# Patient Record
Sex: Female | Born: 1940
Health system: Southern US, Community
[De-identification: ages and names within clinical notes are randomized; demographics above are authoritative.]

## PROBLEM LIST (undated history)

## (undated) DIAGNOSIS — C4491 Basal cell carcinoma of skin, unspecified: Secondary | ICD-10-CM

## (undated) DIAGNOSIS — I10 Essential (primary) hypertension: Secondary | ICD-10-CM

## (undated) HISTORY — DX: Essential (primary) hypertension: I10

---
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
## 1980-05-10 HISTORY — PX: TOTAL ABDOMINAL HYSTERECTOMY: SHX209

## 1998-01-01 ENCOUNTER — Other Ambulatory Visit: Admission: RE | Admit: 1998-01-01 | Discharge: 1998-01-01 | Payer: Self-pay | Admitting: Obstetrics & Gynecology

## 1999-01-06 ENCOUNTER — Other Ambulatory Visit: Admission: RE | Admit: 1999-01-06 | Discharge: 1999-01-06 | Payer: Self-pay | Admitting: Obstetrics & Gynecology

## 1999-11-04 ENCOUNTER — Ambulatory Visit (HOSPITAL_COMMUNITY): Admission: RE | Admit: 1999-11-04 | Discharge: 1999-11-04 | Payer: Self-pay | Admitting: Obstetrics & Gynecology

## 1999-11-04 ENCOUNTER — Encounter (INDEPENDENT_AMBULATORY_CARE_PROVIDER_SITE_OTHER): Payer: Self-pay

## 1999-11-18 ENCOUNTER — Other Ambulatory Visit: Admission: RE | Admit: 1999-11-18 | Discharge: 1999-11-18 | Payer: Self-pay | Admitting: Obstetrics & Gynecology

## 2000-05-10 HISTORY — PX: LAPAROSCOPIC CHOLECYSTECTOMY: SUR755

## 2000-09-19 ENCOUNTER — Emergency Department (HOSPITAL_COMMUNITY): Admission: EM | Admit: 2000-09-19 | Discharge: 2000-09-19 | Payer: Self-pay | Admitting: Emergency Medicine

## 2000-09-19 ENCOUNTER — Encounter: Payer: Self-pay | Admitting: Emergency Medicine

## 2000-09-27 ENCOUNTER — Encounter: Admission: RE | Admit: 2000-09-27 | Discharge: 2000-09-27 | Payer: Self-pay | Admitting: Internal Medicine

## 2000-09-27 ENCOUNTER — Encounter: Payer: Self-pay | Admitting: Internal Medicine

## 2000-09-29 ENCOUNTER — Encounter: Payer: Self-pay | Admitting: Internal Medicine

## 2000-09-29 ENCOUNTER — Encounter: Admission: RE | Admit: 2000-09-29 | Discharge: 2000-09-29 | Payer: Self-pay | Admitting: Internal Medicine

## 2000-10-06 ENCOUNTER — Ambulatory Visit (HOSPITAL_COMMUNITY): Admission: RE | Admit: 2000-10-06 | Discharge: 2000-10-07 | Payer: Self-pay | Admitting: General Surgery

## 2000-10-06 ENCOUNTER — Encounter (INDEPENDENT_AMBULATORY_CARE_PROVIDER_SITE_OTHER): Payer: Self-pay | Admitting: Specialist

## 2001-02-07 ENCOUNTER — Encounter: Payer: Self-pay | Admitting: Internal Medicine

## 2001-02-07 ENCOUNTER — Encounter: Admission: RE | Admit: 2001-02-07 | Discharge: 2001-02-07 | Payer: Self-pay | Admitting: Internal Medicine

## 2001-02-28 ENCOUNTER — Other Ambulatory Visit: Admission: RE | Admit: 2001-02-28 | Discharge: 2001-02-28 | Payer: Self-pay | Admitting: Obstetrics & Gynecology

## 2002-03-05 ENCOUNTER — Other Ambulatory Visit: Admission: RE | Admit: 2002-03-05 | Discharge: 2002-03-05 | Payer: Self-pay | Admitting: Obstetrics & Gynecology

## 2003-03-04 ENCOUNTER — Other Ambulatory Visit: Admission: RE | Admit: 2003-03-04 | Discharge: 2003-03-04 | Payer: Self-pay | Admitting: Obstetrics & Gynecology

## 2004-03-05 ENCOUNTER — Other Ambulatory Visit: Admission: RE | Admit: 2004-03-05 | Discharge: 2004-03-05 | Payer: Self-pay | Admitting: Obstetrics & Gynecology

## 2004-05-13 ENCOUNTER — Encounter: Admission: RE | Admit: 2004-05-13 | Discharge: 2004-05-13 | Payer: Self-pay | Admitting: Internal Medicine

## 2005-03-17 ENCOUNTER — Other Ambulatory Visit: Admission: RE | Admit: 2005-03-17 | Discharge: 2005-03-17 | Payer: Self-pay | Admitting: Obstetrics & Gynecology

## 2007-08-03 ENCOUNTER — Ambulatory Visit: Payer: Self-pay | Admitting: Internal Medicine

## 2007-08-10 ENCOUNTER — Ambulatory Visit: Payer: Self-pay | Admitting: Internal Medicine

## 2007-08-10 ENCOUNTER — Encounter: Payer: Self-pay | Admitting: Internal Medicine

## 2008-09-11 DIAGNOSIS — C4491 Basal cell carcinoma of skin, unspecified: Secondary | ICD-10-CM

## 2008-09-11 HISTORY — DX: Basal cell carcinoma of skin, unspecified: C44.91

## 2010-05-10 HISTORY — PX: OTHER SURGICAL HISTORY: SHX169

## 2010-09-25 NOTE — Op Note (Signed)
[place]. [facility]  Patient:    Christine Hall, Christine Hall                     MRN: [id number] Proc. Date: 10/06/00 Adm. Date:  [id number] Attending:  Sonda Primes                           Operative Report  PREOPERATIVE DIAGNOSIS:  Chronic calculus cholecystitis.  POSTOPERATIVE DIAGNOSIS:  Chronic calculus cholecystitis.  OPERATION PERFORMED:  Laparoscopic cholecystectomy.  SURGEON:  Mardene Celeste. Lurene Shadow, M.D.  ASSISTANT:  Marnee Spring. Wiliam Ke, M.D.  ANESTHESIA:  General.  INDICATIONS FOR PROCEDURE:  The patient is a 70 year old woman with a severe iodine allergy who presents with epigastric and substernal pain associated with nausea and vomiting.  On ultrasound she has cholelithiasis with a thick-walled gallbladder.  She had some mild hyperamylasemia up to 146.  Liver function tests were normal.  She has not been having any chills or jaundice. She is brought to the operating room now for cholecystectomy.  It is felt that we should not do a cholangiogram unless otherwise indicated except for her mild hyperamylasemia because of her iodine allergy.  DESCRIPTION OF PROCEDURE:  Following the induction of anesthesia, the patient was positioned supinely.  The abdomen routinely prepped and draped with Hibiclens solution.  Open laparoscopy at the umbilicus with insertion of a Hasson type cannula and insufflation of the peritoneal cavity up to 14 mmHg pressure using carbon dioxide.  Visual exploration showed the gallbladder to be chronically scarred with multiple adhesions of the gallbladder to the duodenum and surrounding omentum.  Liver edges were sharp.  The liver surface was somewhat sugar-coated, otherwise within normal limits.  Under direct vision, epigastric and lateral ports were placed.  The gallbladder was grasped and retracted cephalad and multiple adhesions were carefully dissected down carrying the dissection down to the ampulla where the cystic duct and  cystic artery were clearly identified.  The cystic duct was traced up to its entry into the gallbladder and the cystic artery traced to its entry into the gallbladder wall.  Both the cystic duct and cystic artery were doubly clipped and transected.  The cystic duct caliber was at the upper limits of normal in size.  The gallbladder was then dissected free from the liver edge using electrocautery and maintaining hemostasis throughout the entire course of the dissection.  At the end of the dissection, additional bleeding points treated with electrocautery.  The camera moved from the epigastric port and the gallbladder retrieved through the umbilical port without difficulty.  Sponge, instrument and sharp counts were verified.  The trocars were removed under direct vision.  The pneumoperitoneum allowed to deflate and the wounds closed in layers as follows.  The umbilical wound in two layers with 0 Dexon and 4-0 Dexon.  Epigastric and lateral wounds closed with 4-0 Dexon sutures.  All wounds were reinforced with Steri-Strips and sterile dressings were applied. Anesthetic reversed.  Patient removed from the operating room to the recovery room in stable condition having tolerated the procedure well.  The patient tolerated the procedure well. DD:  10/06/00 TD:  10/06/00 Job: [id number]

## 2010-09-25 NOTE — Op Note (Signed)
[facility]  Patient:    Christine Hall, Christine Hall                     MRN: [id number] Proc. Date: 11/04/99 Adm. Date:  [id number] Attending:  Minette Headland                           Operative Report  PREOPERATIVE DIAGNOSES:       1. Cystic pelvic mass at vaginal cuff.                               2. Known history of previous pelvic                                  endometriosis.                               3. Recent episode of brown vaginal discharge                                  consistent with scant bleeding vaginally.  POSTOPERATIVE DIAGNOSES:      1. Pelvic adhesions.                               2. Cystic lesion posterior vagina, near the apex                                  consistent with a small endometrioma.                               3. Adhesions of ileum to peritoneum overlying                                  vagina.                               4. Right adnexal adhesions.  OPERATIVE PROCEDURE:          Laparoscopy, lysis of pelvic adhesions both to bowel and adnexa, right salpingo-oophorectomy, fulguration of cystic lesion of posterior vagina.  SURGEON:                      Freddy Finner, M.D.  ANESTHESIA:                   General endotracheal.  INTRAOPERATIVE COMPLICATIONS: None.  INTRAOPERATIVE FINDINGS:  As in the post operative diagnoses.  Findings were recorded in still photographs which were retained in the office record.  DESCRIPTION OF PROCEDURE:     The patient was admitted on the morning of surgery, brought to the operating room and there placed under adequate general endotracheal anesthesia, placed in the dorsolithotomy position using the [facility] stirrup system.  Prep was carried out with Hibiclens because of an allergy to iodine.  The bladder was evacuated with a Robinson catheter.  Sponge forceps  with sponges in the tip was placed into the vagina for use during the procedure.  Sterile drapes were applied.   Two small incisions were made; one at the umbilicus and one just above the symphysis through an old lower abdominal transverse scar.  A 10 mm trocar was introduced at the umbilicus while elevating the anterior abdominal wall manually.  Direct inspection revealed adequate placement with no evidence of injury on entry. Pneumoperitoneum was allowed to accumulate with carbon dioxide gas.  A 5 mm trocar was placed through the lower incision.  A blunt probe and later grasping forceps was used through this lower trocar sleeve.  After careful systematic examination of the pelvic and abdominal contents, the right adnexal adhesions were dissected and the right infundibulopelvic ligament and structures adjacent to the ovary were progressively fulgurated with bipolar forceps and then divided sharply.  The ovary was morcellated and removed through the umbilical trocar sleeve.  Small bleeding sources were controlled with the bipolar forceps.  With very careful dissection, the ileum was then dissected off the peritoneum overlying the vagina.  The lesion posteriorly was fulgurated with the bipolar forceps x 2.  On completion of the procedure hemostasis was adequate.  All instruments were removed.  Gas was allowed escape from the abdomen.  Skin incisions were closed with interrupted subcuticular sutures of 3-0 Dexon.  Steri-Strips were applied to the lower incision.  A total of 10 cc of 0.50% Marcaine was injected into the incision sites for postoperative analgesia.  The patient tolerated the operative procedure well.  She was awakened and taken to the recovery room in good condition.  She will discharged in the immediate postoperative period for follow-up in the office in approximately one week. DD:  11/04/99 TD:  11/04/99 Job: [id number]

## 2011-04-29 ENCOUNTER — Other Ambulatory Visit: Payer: Self-pay | Admitting: Internal Medicine

## 2011-04-29 ENCOUNTER — Ambulatory Visit
Admission: RE | Admit: 2011-04-29 | Discharge: 2011-04-29 | Disposition: A | Discharge status: Home / Self Care | Payer: Medicare Other | Source: Ambulatory Visit | Attending: Internal Medicine | Admitting: Internal Medicine

## 2011-04-29 DIAGNOSIS — R609 Edema, unspecified: Secondary | ICD-10-CM

## 2012-08-02 ENCOUNTER — Encounter: Payer: Self-pay | Admitting: Internal Medicine

## 2013-02-06 ENCOUNTER — Encounter: Payer: Self-pay | Admitting: Internal Medicine

## 2013-04-11 ENCOUNTER — Ambulatory Visit (AMBULATORY_SURGERY_CENTER): Payer: Self-pay | Admitting: *Deleted

## 2013-04-11 VITALS — Ht 63.0 in | Wt 160.6 lb

## 2013-04-11 DIAGNOSIS — Z86010 Personal history of colonic polyps: Secondary | ICD-10-CM

## 2013-04-11 MED ORDER — MOVIPREP 100 G PO SOLR: ORAL | Status: DC

## 2013-04-11 NOTE — Progress Notes (Signed)
No allergies to eggs or soy. No problems with anesthesia.  

## 2013-04-25 ENCOUNTER — Ambulatory Visit (AMBULATORY_SURGERY_CENTER): Payer: Medicare Other | Admitting: Internal Medicine

## 2013-04-25 ENCOUNTER — Encounter: Payer: Self-pay | Admitting: Internal Medicine

## 2013-04-25 VITALS — BP 141/85 | HR 84 | Temp 97.4°F | Resp 18 | Ht 63.0 in | Wt 160.0 lb

## 2013-04-25 DIAGNOSIS — Z860100 Personal history of colon polyps, unspecified: Secondary | ICD-10-CM

## 2013-04-25 DIAGNOSIS — Z800 Family history of malignant neoplasm of digestive organs: Secondary | ICD-10-CM

## 2013-04-25 MED ORDER — SODIUM CHLORIDE 0.9 % IV SOLN: 500.0000 mL | INTRAVENOUS | Status: DC

## 2013-04-25 NOTE — Progress Notes (Signed)
A/ox3 pleased with MAC, report to Celia RN 

## 2013-04-25 NOTE — Progress Notes (Signed)
Patient did not experience any of the following events: a burn prior to discharge; a fall within the facility; wrong site/side/patient/procedure/implant event; or a hospital transfer or hospital admission upon discharge from the facility. (G8907) Patient did not have preoperative order for IV antibiotic SSI prophylaxis. (G8918)  

## 2013-04-25 NOTE — Op Note (Signed)
[facility] [address] [place], [address]   COLONOSCOPY PROCEDURE REPORT  PATIENT: Christine, Hall  MR#: [id number] BIRTHDATE: 30-Sep-1940 , 72  yrs. old GENDER: Female ENDOSCOPIST: Hart Carwin, MD REFERRED AO:ZHYQMV Donette Larry, M.D. PROCEDURE DATE:  04/25/2013 PROCEDURE:   Colonoscopy, screening First Screening Colonoscopy - Avg.  risk and is 50 yrs.  old or older - No.  Prior Negative Screening - Now for repeat screening. N/A  History of Adenoma - Now for follow-up colonoscopy & has been > or = to 3 yrs.  Yes hx of adenoma.  Has been 3 or more years since last colonoscopy.  Polyps Removed Today? No.  Recommend repeat exam, <10 yrs? Yes.  High risk (family or personal hx). ASA CLASS:   Class II INDICATIONS:family history of colon cancer in appearance.  Prior colonoscopies in 1995, 2000, 2009.  Last colonoscopy showed adenomatous polyp. MEDICATIONS: MAC sedation, administered by CRNA and Propofol (Diprivan) 180 mg IV  DESCRIPTION OF PROCEDURE:   After the risks benefits and alternatives of the procedure were thoroughly explained, informed consent was obtained.  A digital rectal exam revealed no abnormalities of the rectum.   The LB PFC-H190 [id number]  endoscope was introduced through the anus and advanced to the cecum, which was identified by both the appendix and ileocecal valve. No adverse events experienced.   The quality of the prep was good, using MoviPrep  The instrument was then slowly withdrawn as the colon was fully examined.      COLON FINDINGS: A normal appearing cecum, ileocecal valve, and appendiceal orifice were identified.  The ascending, hepatic flexure, transverse, splenic flexure, descending, sigmoid colon and rectum appeared unremarkable.  No polyps or cancers were seen. Retroflexed views revealed no abnormalities. The time to cecum=5 minutes 7 seconds.  Withdrawal time=8 minutes 52 seconds.  The scope was withdrawn and the procedure  completed. COMPLICATIONS: There were no complications.  ENDOSCOPIC IMPRESSION: Normal colon  RECOMMENDATIONS: high-fiber diet Recall colonoscopy in 5 years   eSigned:  Hart Carwin, MD 04/25/2013 10:42 AM   cc:   PATIENT NAME:  Christine, Hall MR#: [id number]

## 2013-04-25 NOTE — Patient Instructions (Signed)
Discharge instructions given with verbal understanding. Normal exam. Resume previous medications. YOU HAD AN ENDOSCOPIC PROCEDURE TODAY AT THE [facility]: Refer to the procedure report that was given to you for any specific questions about what was found during the examination.  If the procedure report does not answer your questions, please call your gastroenterologist to clarify.  If you requested that your care partner not be given the details of your procedure findings, then the procedure report has been included in a sealed envelope for you to review at your convenience later.  YOU SHOULD EXPECT: Some feelings of bloating in the abdomen. Passage of more gas than usual.  Walking can help get rid of the air that was put into your GI tract during the procedure and reduce the bloating. If you had a lower endoscopy (such as a colonoscopy or flexible sigmoidoscopy) you may notice spotting of blood in your stool or on the toilet paper. If you underwent a bowel prep for your procedure, then you may not have a normal bowel movement for a few days.  DIET: Your first meal following the procedure should be a light meal and then it is ok to progress to your normal diet.  A half-sandwich or bowl of soup is an example of a good first meal.  Heavy or fried foods are harder to digest and may make you feel nauseous or bloated.  Likewise meals heavy in dairy and vegetables can cause extra gas to form and this can also increase the bloating.  Drink plenty of fluids but you should avoid alcoholic beverages for 24 hours.  ACTIVITY: Your care partner should take you home directly after the procedure.  You should plan to take it easy, moving slowly for the rest of the day.  You can resume normal activity the day after the procedure however you should NOT DRIVE or use heavy machinery for 24 hours (because of the sedation medicines used during the test).    SYMPTOMS TO REPORT IMMEDIATELY: A gastroenterologist  can be reached at any hour.  During normal business hours, 8:30 AM to 5:00 PM Monday through Friday, call [phone].  After hours and on weekends, please call the GI answering service at [phone] who will take a message and have the physician on call contact you.   Following lower endoscopy (colonoscopy or flexible sigmoidoscopy):  Excessive amounts of blood in the stool  Significant tenderness or worsening of abdominal pains  Swelling of the abdomen that is new, acute  Fever of 100F or higher  FOLLOW UP: If any biopsies were taken you will be contacted by phone or by letter within the next 1-3 weeks.  Call your gastroenterologist if you have not heard about the biopsies in 3 weeks.  Our staff will call the home number listed on your records the next business day following your procedure to check on you and address any questions or concerns that you may have at that time regarding the information given to you following your procedure. This is a courtesy call and so if there is no answer at the home number and we have not heard from you through the emergency physician on call, we will assume that you have returned to your regular daily activities without incident.  SIGNATURES/CONFIDENTIALITY: You and/or your care partner have signed paperwork which will be entered into your electronic medical record.  These signatures attest to the fact that that the information above on your After Visit Summary has been reviewed   and is understood.  Full responsibility of the confidentiality of this discharge information lies with you and/or your care-partner. 

## 2013-04-26 ENCOUNTER — Telehealth: Payer: Self-pay

## 2013-04-26 NOTE — Telephone Encounter (Signed)
  Follow up Call-  Call back number 04/25/2013  Post procedure Call Back phone  # [phone]  Permission to leave phone message Yes     Patient questions:  Do you have a fever, pain , or abdominal swelling? no Pain Score  0 *  Have you tolerated food without any problems? yes  Have you been able to return to your normal activities? yes  Do you have any questions about your discharge instructions: Diet   no Medications  no Follow up visit  no  Do you have questions or concerns about your Care? no  Actions: * If pain score is 4 or above: No action needed, pain <4.

## 2013-10-18 ENCOUNTER — Other Ambulatory Visit: Payer: Self-pay | Admitting: Dermatology

## 2013-10-18 DIAGNOSIS — C4491 Basal cell carcinoma of skin, unspecified: Secondary | ICD-10-CM

## 2013-10-18 HISTORY — DX: Basal cell carcinoma of skin, unspecified: C44.91

## 2014-08-28 ENCOUNTER — Other Ambulatory Visit: Payer: Self-pay | Admitting: Obstetrics & Gynecology

## 2014-08-30 ENCOUNTER — Other Ambulatory Visit: Payer: Self-pay | Admitting: Obstetrics & Gynecology

## 2014-08-30 DIAGNOSIS — R928 Other abnormal and inconclusive findings on diagnostic imaging of breast: Secondary | ICD-10-CM

## 2014-08-30 LAB — CYTOLOGY - PAP

## 2014-09-06 ENCOUNTER — Ambulatory Visit
Admission: RE | Admit: 2014-09-06 | Discharge: 2014-09-06 | Disposition: A | Discharge status: Home / Self Care | Payer: Medicare Other | Source: Ambulatory Visit | Attending: Obstetrics & Gynecology | Admitting: Obstetrics & Gynecology

## 2014-09-06 ENCOUNTER — Encounter (INDEPENDENT_AMBULATORY_CARE_PROVIDER_SITE_OTHER): Payer: Self-pay

## 2014-09-06 DIAGNOSIS — R928 Other abnormal and inconclusive findings on diagnostic imaging of breast: Secondary | ICD-10-CM

## 2015-02-26 ENCOUNTER — Encounter: Payer: Self-pay | Admitting: Internal Medicine

## 2015-04-01 ENCOUNTER — Other Ambulatory Visit: Payer: Self-pay | Admitting: Internal Medicine

## 2015-04-01 DIAGNOSIS — R1012 Left upper quadrant pain: Principal | ICD-10-CM

## 2015-04-01 DIAGNOSIS — R1011 Right upper quadrant pain: Secondary | ICD-10-CM

## 2015-04-08 ENCOUNTER — Ambulatory Visit
Admission: RE | Admit: 2015-04-08 | Discharge: 2015-04-08 | Disposition: A | Discharge status: Home / Self Care | Payer: Medicare Other | Source: Ambulatory Visit | Attending: Internal Medicine | Admitting: Internal Medicine

## 2015-04-08 ENCOUNTER — Other Ambulatory Visit: Payer: Self-pay | Admitting: Internal Medicine

## 2015-04-08 ENCOUNTER — Other Ambulatory Visit: Payer: Medicare Other

## 2015-04-08 DIAGNOSIS — R1012 Left upper quadrant pain: Principal | ICD-10-CM

## 2015-04-08 DIAGNOSIS — R1011 Right upper quadrant pain: Secondary | ICD-10-CM

## 2017-01-20 ENCOUNTER — Ambulatory Visit
Admission: RE | Admit: 2017-01-20 | Discharge: 2017-01-20 | Disposition: A | Discharge status: Home / Self Care | Payer: Medicare Other | Source: Ambulatory Visit | Attending: Nurse Practitioner | Admitting: Nurse Practitioner

## 2017-01-20 ENCOUNTER — Other Ambulatory Visit: Payer: Self-pay | Admitting: Nurse Practitioner

## 2017-01-20 DIAGNOSIS — M79604 Pain in right leg: Secondary | ICD-10-CM

## 2017-02-09 ENCOUNTER — Other Ambulatory Visit: Payer: Self-pay | Admitting: Dermatology

## 2018-06-15 ENCOUNTER — Encounter: Payer: Self-pay | Admitting: Gastroenterology

## 2018-09-14 ENCOUNTER — Encounter: Payer: Self-pay | Admitting: Gastroenterology

## 2020-03-11 ENCOUNTER — Ambulatory Visit: Payer: Medicare Other | Attending: Internal Medicine

## 2020-03-11 ENCOUNTER — Other Ambulatory Visit (HOSPITAL_BASED_OUTPATIENT_CLINIC_OR_DEPARTMENT_OTHER): Payer: Self-pay | Admitting: Internal Medicine

## 2020-03-11 DIAGNOSIS — Z23 Encounter for immunization: Secondary | ICD-10-CM

## 2020-03-11 NOTE — Progress Notes (Signed)
   Covid-19 Vaccination Clinic  Name:  Christine Hall    MRN: [id number] DOB: Jan 28, 1941  03/11/2020  [name] was observed post Covid-19 immunization for 15 minutes without incident. She was provided with Vaccine Information Sheet and instruction to access the V-Safe system.   [name] was instructed to call 911 with any severe reactions post vaccine: Marland Kitchen Difficulty breathing  . Swelling of face and throat  . A fast heartbeat  . A bad rash all over body  . Dizziness and weakness

## 2020-03-20 MED FILL — MODERNA COVID-19 VACCINE 10: 100 | 1 days supply | Qty: 0 | Fill #0

## 2021-01-28 ENCOUNTER — Other Ambulatory Visit (HOSPITAL_BASED_OUTPATIENT_CLINIC_OR_DEPARTMENT_OTHER): Payer: Self-pay

## 2021-01-28 MED ORDER — INFLUENZA VAC A&B SA ADJ QUAD 0.5 ML IM PRSY
PREFILLED_SYRINGE | INTRAMUSCULAR | 0 refills | Status: DC
  Filled 2021-01-28: qty 0.5, 1d supply, fill #0

## 2021-02-17 ENCOUNTER — Ambulatory Visit: Payer: Medicare Other | Attending: Internal Medicine

## 2021-02-17 DIAGNOSIS — Z23 Encounter for immunization: Secondary | ICD-10-CM

## 2021-02-17 NOTE — Progress Notes (Signed)
   Covid-19 Vaccination Clinic  Name:  MAVEN ROSANDER    MRN: [id number] DOB: [birth year]  02/17/2021  Ms. Ballon was observed post Covid-19 immunization for 15 minutes without incident. She was provided with Vaccine Information Sheet and instruction to access the V-Safe system.   Ms. Skelton was instructed to call 911 with any severe reactions post vaccine: Difficulty breathing  Swelling of face and throat  A fast heartbeat  A bad rash all over body  Dizziness and weakness

## 2021-02-27 ENCOUNTER — Other Ambulatory Visit (HOSPITAL_BASED_OUTPATIENT_CLINIC_OR_DEPARTMENT_OTHER): Payer: Self-pay

## 2021-02-27 MED ORDER — MODERNA COVID-19 BIVAL BOOSTER 50 MCG/0.5ML IM SUSP
INTRAMUSCULAR | 0 refills | Status: DC
  Filled 2021-02-27: qty 0.5, 1d supply, fill #0

## 2021-03-17 ENCOUNTER — Other Ambulatory Visit: Payer: Self-pay | Admitting: Internal Medicine

## 2021-03-17 DIAGNOSIS — M7989 Other specified soft tissue disorders: Secondary | ICD-10-CM

## 2021-03-20 ENCOUNTER — Other Ambulatory Visit: Payer: Medicare Other

## 2021-05-06 ENCOUNTER — Ambulatory Visit: Payer: Medicare Other | Admitting: Cardiology

## 2021-05-07 ENCOUNTER — Other Ambulatory Visit: Payer: Self-pay

## 2021-05-07 ENCOUNTER — Encounter: Payer: Self-pay | Admitting: Cardiology

## 2021-05-07 ENCOUNTER — Ambulatory Visit: Payer: Medicare Other | Admitting: Cardiology

## 2021-05-07 VITALS — BP 164/90 | HR 74 | Temp 98.1°F | Ht <= 58 in | Wt 156.0 lb

## 2021-05-07 DIAGNOSIS — R9431 Abnormal electrocardiogram [ECG] [EKG]: Secondary | ICD-10-CM

## 2021-05-07 DIAGNOSIS — I5032 Chronic diastolic (congestive) heart failure: Secondary | ICD-10-CM

## 2021-05-07 DIAGNOSIS — R0609 Other forms of dyspnea: Secondary | ICD-10-CM

## 2021-05-07 DIAGNOSIS — I272 Pulmonary hypertension, unspecified: Secondary | ICD-10-CM

## 2021-05-07 DIAGNOSIS — I10 Essential (primary) hypertension: Secondary | ICD-10-CM

## 2021-05-07 MED ORDER — OLMESARTAN MEDOXOMIL-HCTZ 20-12.5 MG PO TABS: 1.0000 | ORAL_TABLET | ORAL | 2 refills | Status: DC

## 2021-05-07 MED ORDER — SPIRONOLACTONE 25 MG PO TABS
25.0000 mg | ORAL_TABLET | ORAL | 2 refills | Status: DC
Start: 2021-05-07 — End: 2021-06-10

## 2021-05-07 MED ORDER — METOPROLOL SUCCINATE ER 25 MG PO TB24: 25.0000 mg | ORAL_TABLET | Freq: Every day | ORAL | 2 refills | Status: DC

## 2021-05-07 NOTE — Progress Notes (Signed)
Primary Physician/Referring:  Wenda Low, MD  Patient ID: Christine Hall, female    DOB: 1941-04-03, 80 y.o.   MRN: [id number]  Chief Complaint  Patient presents with   Shortness of Breath   New Patient (Initial Visit)   HPI:    Christine Hall  is a 80 y.o. Caucasian female patient who is very active and lives independently and was doing well until October 2022, suddenly started having marked dyspnea on exertion and marked fatigue even with minimal activity and also leg edema.  She was treated with diuretics, lower extremity duplex was negative for DVT, echocardiogram obtained revealed moderate TR and moderate pulmonary hypertension with RV strain and she is now referred to me for evaluation of dyspnea on exertion.  Patient symptoms are class III-IV, patient states that even doing activities of daily living is a chore, she gets extremely winded.  She has had episodes of PND and orthopnea as well.  Leg edema is improved since being on Lasix that was started by her PCP.  She also states that she has had 2-3 episodes of chest pain in the middle of the chest and sometimes feels like it radiates to the left side of the chest lasting 15 to 20 minutes while doing routine activities and 1 time while she was sleeping and woke up in the morning.  Past Medical History:  Diagnosis Date   Basal cell carcinoma 09/11/2008   left inner eye tx m[facility]   BCC (basal cell carcinoma of skin) 10/18/2013   left cheek tx m[facility]s   Hypertension    Past Surgical History:  Procedure Laterality Date   LAPAROSCOPIC CHOLECYSTECTOMY  2002   macular pucker Left 2012   TOTAL ABDOMINAL HYSTERECTOMY  1982   Family History  Problem Relation Age of Onset   Heart attack Mother    Heart attack Father    Colon cancer Father 63   Atrial fibrillation Sister    Heart attack Brother     Social History   Tobacco Use   Smoking status: Never   Smokeless tobacco: Never  Substance Use Topics   Alcohol use: No    Marital Status: Married  ROS  Review of Systems  Constitutional: Positive for malaise/fatigue.  Cardiovascular:  Positive for chest pain, dyspnea on exertion and leg swelling (left).  Gastrointestinal:  Negative for melena.  Objective  Blood pressure (!) 164/90, pulse 74, temperature 98.1 F (36.7 C), height (!) 5.4" (0.137 m), weight 156 lb (70.8 kg), SpO2 99 %. Body mass index is 3,761.32 kg/m.  Vitals with BMI 05/07/2021 05/07/2021 04/25/2013  Height - 0' 5.4" -  Weight - 156 lbs -  BMI - 1950.9 -  Systolic 326 712 458  Diastolic 90 76 85  Pulse 74 64 84    Physical Exam Neck:     Vascular: JVD present. No carotid bruit.  Cardiovascular:     Rate and Rhythm: Normal rate and regular rhythm.     Pulses: Intact distal pulses.     Heart sounds: Normal heart sounds. No murmur heard.   No gallop.  Pulmonary:     Effort: Pulmonary effort is normal.     Breath sounds: Normal breath sounds.  Abdominal:     General: Bowel sounds are normal.     Palpations: Abdomen is soft.  Musculoskeletal:     Right lower leg: No edema.     Left lower leg: Edema (trace) present.     Laboratory examination:   External labs:  Cholesterol, total 215.000 m 05/21/2020 HDL 91.000 mg 05/21/2020 LDL 109.000 m 05/21/2020 Triglycerides 82.000 mg 05/21/2020  Hemoglobin 12.900 g/d 03/17/2021 Hematocrit 39.1 Platelets 174  BUN 20 Creatinine, Serum 1.040 mg/ 03/17/2021 eGFR >60 Potassium 4.500 mg/ 03/17/2021 ALT (SGPT) 12.000 U/L 05/21/2020  TSH 1.680 03/17/2021  BNP 227.500 P 03/17/2021  Medications and allergies   Allergies  Allergen Reactions   Bystolic [Nebivolol Hcl] Diarrhea, Nausea Only and Swelling   Codeine Nausea Only   Other Nausea And Vomiting    All seafood   Plendil [Felodipine]     Increase BP, leg selling   Iodine Rash    fever   Latex Rash    redness   Neosporin [Neomycin-Bacitracin Zn-Polymyx] Rash    redness   Tape Rash     Medication prior to this encounter:    Outpatient Medications Prior to Visit  Medication Sig Dispense Refill   cholecalciferol (VITAMIN D) 1000 UNITS tablet Take 1,000 Units by mouth daily.     furosemide (LASIX) 20 MG tablet Take 20 mg by mouth 3 (three) times a week.     lisinopril (PRINIVIL,ZESTRIL) 40 MG tablet Take 20 mg by mouth daily.     lisinopril (ZESTRIL) 20 MG tablet Take 20 mg by mouth daily.     COVID-19 mRNA bivalent vaccine, Moderna, (MODERNA COVID-19 BIVAL BOOSTER) 50 MCG/0.5ML injection Inject into the muscle. 0.5 mL 0   influenza vaccine adjuvanted (FLUAD) 0.5 ML injection Inject into the muscle. 0.5 mL 0   amLODipine (NORVASC) 2.5 MG tablet Take 2.5 mg by mouth daily.     estradiol (MINIVELLE) 0.1 MG/24HR patch Place 1 patch onto the skin 2 (two) times a week.     No facility-administered medications prior to visit.     Medication list after today's encounter   Current Outpatient Medications  Medication Instructions   cholecalciferol (VITAMIN D) 1,000 Units, Daily   COVID-19 mRNA bivalent vaccine, Moderna, (MODERNA COVID-19 BIVAL BOOSTER) 50 MCG/0.5ML injection Intramuscular   influenza vaccine adjuvanted (FLUAD) 0.5 ML injection Intramuscular   metoprolol succinate (TOPROL-XL) 25 mg, Oral, Daily, Take with or immediately following a meal.   olmesartan-hydrochlorothiazide (BENICAR HCT) 20-12.5 MG tablet 1 tablet, Oral, BH-each morning   spironolactone (ALDACTONE) 25 mg, Oral, BH-each morning    Radiology:   No results found.  Cardiac Studies:   Echocardiogram 04/01/2021:    1. Normal LV systolic function with visual EF 60-65%. Left ventricle cavity is normal in size. Normal left ventricular wall thickness. Normal global wall motion. Normal diastolic filling pattern, normal LAP. 2. Right atrial cavity is mildly dilated. 3. Right ventricle cavity is slightly dilated. Normal right ventricular function. 4. Mild (Grade I) mitral regurgitation. 5. Moderate tricuspid regurgitation. Moderate  pulmonary hypertension. RVSP measures 58 mmHg.  Bilateral lower extremity venous duplex 04/29/2011: No evidence of bilateral lower extremity DVT.  EKG:   EKG 05/07/2021: Normal sinus rhythm at rate of 69 bpm, right atrial enlargement, normal axis.  Incomplete right bundle branch block.  Poor R wave progression, cannot exclude anteroseptal infarct old.  Normal QT interval.    Assessment     ICD-10-CM   1. Pulmonary hypertension (HCC)  I27.20     2. Chronic diastolic (congestive) heart failure (HCC)  I50.32 Pro b natriuretic peptide (BNP)    3. DOE (dyspnea on exertion)  R06.09 EKG 12-Lead    ANA    CBC    4. Primary hypertension  H47 Basic metabolic panel    5. Nonspecific abnormal electrocardiogram (ECG) (  EKG)  R94.31        Medications Discontinued During This Encounter  Medication Reason   amLODipine (NORVASC) 2.5 MG tablet Completed Course   estradiol (MINIVELLE) 0.1 MG/24HR patch    lisinopril (PRINIVIL,ZESTRIL) 40 MG tablet    lisinopril (ZESTRIL) 20 MG tablet Change in therapy   furosemide (LASIX) 20 MG tablet Change in therapy    Meds ordered this encounter  Medications   metoprolol succinate (TOPROL-XL) 25 MG 24 hr tablet    Sig: Take 1 tablet (25 mg total) by mouth daily. Take with or immediately following a meal.    Dispense:  30 tablet    Refill:  2   olmesartan-hydrochlorothiazide (BENICAR HCT) 20-12.5 MG tablet    Sig: Take 1 tablet by mouth every morning.    Dispense:  30 tablet    Refill:  2   spironolactone (ALDACTONE) 25 MG tablet    Sig: Take 1 tablet (25 mg total) by mouth every morning.    Dispense:  30 tablet    Refill:  2   Orders Placed This Encounter  Procedures   ANA   Pro b natriuretic peptide (BNP)   Basic metabolic panel   CBC   EKG 12-Lead   Recommendations:   Christine Hall is a 80 y.o. Caucasian female patient who is very active and lives independently and was doing well until October 2022, suddenly started having marked  dyspnea on exertion and marked fatigue even with minimal activity and also leg edema.  She was treated with diuretics, lower extremity duplex was negative for DVT, echocardiogram obtained revealed moderate TR and moderate pulmonary hypertension with RV strain and she is now referred to me for evaluation of dyspnea on exertion.  On exam she has mild JVD but otherwise unremarkable physical exam and no murmur appreciated, trace left ankle edema.  EKG is markedly abnormal showing P pulmonale.  Echocardiogram reviewed, patient clearly has RV strain.  Question is whether patient is having primary pulmonary hypertension and acute on chronic cor pulmonale with evidence of acute diastolic heart failure with elevated BNP or whether it is a secondary phenomena and chronic diastolic heart failure with elevated pulmonary pressures is to be further determined.  Her symptoms are class III-IV.  Best option is to proceed directly with left and right heart catheterization to evaluate for coronary anatomy and also do right heart catheterization to evaluate for pulmonary hypertension.  If cardiac catheterization is unrevealing, we could also consider CT angiogram of the chest to exclude pulmonary embolism.  She has also had about 3 episodes of chest tightness retrosternal and although symptoms appear atypical, overall clinical presentation is concerning with rapid decline in just 2 months in her physical status.  Will try to get prior authorization from insurance, with precath labs, I will also obtain ANA and proBNP.  I would like to see her back in 4 to 6 weeks for follow-up.  With regard to management of heart failure and pulm hypertension, patient states that her blood pressure at home has been controlled.  I will discontinue lisinopril and also furosemide and start the patient on Metoprolol succinate 25 mg daily, olmesartan HCT 20/12.5 mg every morning along with spironolactone 25 mg in the morning.  This is a  60-minute office visit encounter in evaluation of extremely complex presentation and external labs and discussions regarding procedures.   Adrian Prows, MD, [facility] 05/07/2021, 1:04 PM Office: [phone]

## 2021-05-07 NOTE — H&P (View-Only) (Signed)
Primary Physician/Referring:  Wenda Low, MD  Patient ID: Christine Hall, female    DOB: 1940/11/27, 80 y.o.   MRN: [id number]  Chief Complaint  Patient presents with   Shortness of Breath   New Patient (Initial Visit)   HPI:    Christine Hall  is a 80 y.o. Caucasian female patient who is very active and lives independently and was doing well until October 2022, suddenly started having marked dyspnea on exertion and marked fatigue even with minimal activity and also leg edema.  She was treated with diuretics, lower extremity duplex was negative for DVT, echocardiogram obtained revealed moderate TR and moderate pulmonary hypertension with RV strain and she is now referred to me for evaluation of dyspnea on exertion.  Patient symptoms are class III-IV, patient states that even doing activities of daily living is a chore, she gets extremely winded.  She has had episodes of PND and orthopnea as well.  Leg edema is improved since being on Lasix that was started by her PCP.  She also states that she has had 2-3 episodes of chest pain in the middle of the chest and sometimes feels like it radiates to the left side of the chest lasting 15 to 20 minutes while doing routine activities and 1 time while she was sleeping and woke up in the morning.  Past Medical History:  Diagnosis Date   Basal cell carcinoma 09/11/2008   left inner eye tx mohs   BCC (basal cell carcinoma of skin) 10/18/2013   left cheek tx mohs   Hypertension    Past Surgical History:  Procedure Laterality Date   LAPAROSCOPIC CHOLECYSTECTOMY  2002   macular pucker Left 2012   TOTAL ABDOMINAL HYSTERECTOMY  1982   Family History  Problem Relation Age of Onset   Heart attack Mother    Heart attack Father    Colon cancer Father 67   Atrial fibrillation Sister    Heart attack Brother     Social History   Tobacco Use   Smoking status: Never   Smokeless tobacco: Never  Substance Use Topics   Alcohol use: No    Marital Status: Married  ROS  Review of Systems  Constitutional: Positive for malaise/fatigue.  Cardiovascular:  Positive for chest pain, dyspnea on exertion and leg swelling (left).  Gastrointestinal:  Negative for melena.  Objective  Blood pressure (!) 164/90, pulse 74, temperature 98.1 F (36.7 C), height (!) 5.4" (0.137 m), weight 156 lb (70.8 kg), SpO2 99 %. Body mass index is 3,761.32 kg/m.  Vitals with BMI 05/07/2021 05/07/2021 04/25/2013  Height - 0' 5.4" -  Weight - 156 lbs -  BMI - 1761.6 -  Systolic 073 710 626  Diastolic 90 76 85  Pulse 74 64 84    Physical Exam Neck:     Vascular: JVD present. No carotid bruit.  Cardiovascular:     Rate and Rhythm: Normal rate and regular rhythm.     Pulses: Intact distal pulses.     Heart sounds: Normal heart sounds. No murmur heard.   No gallop.  Pulmonary:     Effort: Pulmonary effort is normal.     Breath sounds: Normal breath sounds.  Abdominal:     General: Bowel sounds are normal.     Palpations: Abdomen is soft.  Musculoskeletal:     Right lower leg: No edema.     Left lower leg: Edema (trace) present.     Laboratory examination:   External labs:  Cholesterol, total 215.000 m 05/21/2020 HDL 91.000 mg 05/21/2020 LDL 109.000 m 05/21/2020 Triglycerides 82.000 mg 05/21/2020  Hemoglobin 12.900 g/d 03/17/2021 Hematocrit 39.1 Platelets 174  BUN 20 Creatinine, Serum 1.040 mg/ 03/17/2021 eGFR >60 Potassium 4.500 mg/ 03/17/2021 ALT (SGPT) 12.000 U/L 05/21/2020  TSH 1.680 03/17/2021  BNP 227.500 P 03/17/2021  Medications and allergies   Allergies  Allergen Reactions   Bystolic [Nebivolol Hcl] Diarrhea, Nausea Only and Swelling   Codeine Nausea Only   Other Nausea And Vomiting    All seafood   Plendil [Felodipine]     Increase BP, leg selling   Iodine Rash    fever   Latex Rash    redness   Neosporin [Neomycin-Bacitracin Zn-Polymyx] Rash    redness   Tape Rash     Medication prior to this encounter:    Outpatient Medications Prior to Visit  Medication Sig Dispense Refill   cholecalciferol (VITAMIN D) 1000 UNITS tablet Take 1,000 Units by mouth daily.     furosemide (LASIX) 20 MG tablet Take 20 mg by mouth 3 (three) times a week.     lisinopril (PRINIVIL,ZESTRIL) 40 MG tablet Take 20 mg by mouth daily.     lisinopril (ZESTRIL) 20 MG tablet Take 20 mg by mouth daily.     COVID-19 mRNA bivalent vaccine, Moderna, (MODERNA COVID-19 BIVAL BOOSTER) 50 MCG/0.5ML injection Inject into the muscle. 0.5 mL 0   influenza vaccine adjuvanted (FLUAD) 0.5 ML injection Inject into the muscle. 0.5 mL 0   amLODipine (NORVASC) 2.5 MG tablet Take 2.5 mg by mouth daily.     estradiol (MINIVELLE) 0.1 MG/24HR patch Place 1 patch onto the skin 2 (two) times a week.     No facility-administered medications prior to visit.     Medication list after today's encounter   Current Outpatient Medications  Medication Instructions   cholecalciferol (VITAMIN D) 1,000 Units, Daily   COVID-19 mRNA bivalent vaccine, Moderna, (MODERNA COVID-19 BIVAL BOOSTER) 50 MCG/0.5ML injection Intramuscular   influenza vaccine adjuvanted (FLUAD) 0.5 ML injection Intramuscular   metoprolol succinate (TOPROL-XL) 25 mg, Oral, Daily, Take with or immediately following a meal.   olmesartan-hydrochlorothiazide (BENICAR HCT) 20-12.5 MG tablet 1 tablet, Oral, BH-each morning   spironolactone (ALDACTONE) 25 mg, Oral, BH-each morning    Radiology:   No results found.  Cardiac Studies:   Echocardiogram 04/01/2021:    1. Normal LV systolic function with visual EF 60-65%. Left ventricle cavity is normal in size. Normal left ventricular wall thickness. Normal global wall motion. Normal diastolic filling pattern, normal LAP. 2. Right atrial cavity is mildly dilated. 3. Right ventricle cavity is slightly dilated. Normal right ventricular function. 4. Mild (Grade I) mitral regurgitation. 5. Moderate tricuspid regurgitation. Moderate  pulmonary hypertension. RVSP measures 58 mmHg.  Bilateral lower extremity venous duplex 04/29/2011: No evidence of bilateral lower extremity DVT.  EKG:   EKG 05/07/2021: Normal sinus rhythm at rate of 69 bpm, right atrial enlargement, normal axis.  Incomplete right bundle branch block.  Poor R wave progression, cannot exclude anteroseptal infarct old.  Normal QT interval.    Assessment     ICD-10-CM   1. Pulmonary hypertension (HCC)  I27.20     2. Chronic diastolic (congestive) heart failure (HCC)  I50.32 Pro b natriuretic peptide (BNP)    3. DOE (dyspnea on exertion)  R06.09 EKG 12-Lead    ANA    CBC    4. Primary hypertension  D63 Basic metabolic panel    5. Nonspecific abnormal electrocardiogram (ECG) (  EKG)  R94.31        Medications Discontinued During This Encounter  Medication Reason   amLODipine (NORVASC) 2.5 MG tablet Completed Course   estradiol (MINIVELLE) 0.1 MG/24HR patch    lisinopril (PRINIVIL,ZESTRIL) 40 MG tablet    lisinopril (ZESTRIL) 20 MG tablet Change in therapy   furosemide (LASIX) 20 MG tablet Change in therapy    Meds ordered this encounter  Medications   metoprolol succinate (TOPROL-XL) 25 MG 24 hr tablet    Sig: Take 1 tablet (25 mg total) by mouth daily. Take with or immediately following a meal.    Dispense:  30 tablet    Refill:  2   olmesartan-hydrochlorothiazide (BENICAR HCT) 20-12.5 MG tablet    Sig: Take 1 tablet by mouth every morning.    Dispense:  30 tablet    Refill:  2   spironolactone (ALDACTONE) 25 MG tablet    Sig: Take 1 tablet (25 mg total) by mouth every morning.    Dispense:  30 tablet    Refill:  2   Orders Placed This Encounter  Procedures   ANA   Pro b natriuretic peptide (BNP)   Basic metabolic panel   CBC   EKG 12-Lead   Recommendations:   EYLEEN RAWLINSON is a 80 y.o. Caucasian female patient who is very active and lives independently and was doing well until October 2022, suddenly started having marked  dyspnea on exertion and marked fatigue even with minimal activity and also leg edema.  She was treated with diuretics, lower extremity duplex was negative for DVT, echocardiogram obtained revealed moderate TR and moderate pulmonary hypertension with RV strain and she is now referred to me for evaluation of dyspnea on exertion.  On exam she has mild JVD but otherwise unremarkable physical exam and no murmur appreciated, trace left ankle edema.  EKG is markedly abnormal showing P pulmonale.  Echocardiogram reviewed, patient clearly has RV strain.  Question is whether patient is having primary pulmonary hypertension and acute on chronic cor pulmonale with evidence of acute diastolic heart failure with elevated BNP or whether it is a secondary phenomena and chronic diastolic heart failure with elevated pulmonary pressures is to be further determined.  Her symptoms are class III-IV.  Best option is to proceed directly with left and right heart catheterization to evaluate for coronary anatomy and also do right heart catheterization to evaluate for pulmonary hypertension.  If cardiac catheterization is unrevealing, we could also consider CT angiogram of the chest to exclude pulmonary embolism.  She has also had about 3 episodes of chest tightness retrosternal and although symptoms appear atypical, overall clinical presentation is concerning with rapid decline in just 2 months in her physical status.  Will try to get prior authorization from insurance, with precath labs, I will also obtain ANA and proBNP.  I would like to see her back in 4 to 6 weeks for follow-up.  With regard to management of heart failure and pulm hypertension, patient states that her blood pressure at home has been controlled.  I will discontinue lisinopril and also furosemide and start the patient on Metoprolol succinate 25 mg daily, olmesartan HCT 20/12.5 mg every morning along with spironolactone 25 mg in the morning.  This is a  60-minute office visit encounter in evaluation of extremely complex presentation and external labs and discussions regarding procedures.   Adrian Prows, MD, [facility] 05/07/2021, 1:04 PM Office: [phone]

## 2021-05-07 NOTE — Patient Instructions (Signed)
Please go to [facility] and obtain blood work either 2 to 3 days before your heart catheterization or in 2 weeks after you start the medication whichever comes first.

## 2021-05-13 ENCOUNTER — Telehealth: Payer: Self-pay

## 2021-05-13 NOTE — Telephone Encounter (Signed)
Pharmacists called about medication interactions and concerns when refilling.  Spironolactone interaction with Lisinopril. (Just wants to make we are monitoring)  Metoprolol : Patient reported allergy to beta-blockers.   I did explain to the pharmacist that the patient has not reported a beta blocker allergy to [place].

## 2021-05-13 NOTE — Telephone Encounter (Signed)
Agree. She should continue present meds

## 2021-05-18 ENCOUNTER — Telehealth: Payer: Self-pay

## 2021-05-18 NOTE — Telephone Encounter (Signed)
Patient called back, I have spoke with her and let her know.

## 2021-05-19 ENCOUNTER — Telehealth: Payer: Self-pay

## 2021-05-19 NOTE — Telephone Encounter (Signed)
Tell her not to change anything for now and to bring all her medications to the hospital. Also please call and reconcile the medications on our chart as well

## 2021-05-19 NOTE — Telephone Encounter (Signed)
Pts pharmacy called and stated that the pt has not started the medications that were prescribed at the last office visit. She is continuing to take the Lisinopril and Furosemide. Pt wants to know if she should still switch at this point since she will be having a cath done. Informed her of notes in chart already. Please advise.

## 2021-05-20 ENCOUNTER — Other Ambulatory Visit: Payer: Self-pay | Admitting: Cardiology

## 2021-05-20 DIAGNOSIS — R0609 Other forms of dyspnea: Secondary | ICD-10-CM

## 2021-05-20 DIAGNOSIS — R0789 Other chest pain: Secondary | ICD-10-CM

## 2021-05-20 NOTE — Telephone Encounter (Signed)
Called and spoke to pt, pt agreed to not change anything with her medications yet and will bring medications to the hospital for her cath. 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 Pt stated that she had broke out in a rash and had a fever about 20 years ago when she used a lotion with iodine in it. She also stated a nurse confirmed the allergy to iodine. Pt denies any symptoms of anaphylaxis. Please advise.

## 2021-05-20 NOTE — Telephone Encounter (Signed)
I will do contrast prophylaxis, she does not need to worry.   Come 30 minutes early to short

## 2021-05-21 LAB — ANA: ANA Titer 1: POSITIVE — AB

## 2021-05-21 LAB — ENA+DNA/DS+ANTICH+CENTRO+FA...
Anti JO-1: <0.2 AI (ref 0.0–0.9)
Centromere Ab Screen: <0.2 AI (ref 0.0–0.9)
Chromatin Ab SerPl-aCnc: <0.2 AI (ref 0.0–0.9)
ENA RNP Ab: <0.2 AI (ref 0.0–0.9)
ENA SM Ab Ser-aCnc: <0.2 AI (ref 0.0–0.9)
ENA SSA (RO) Ab: <0.2 AI (ref 0.0–0.9)
ENA SSB (LA) Ab: <0.2 AI (ref 0.0–0.9)
Scleroderma (Scl-70) (ENA) Antibody, IgG: <0.2 AI (ref 0.0–0.9)
Speckled Pattern: 1:80 {titer}
dsDNA Ab: <1 IU/mL (ref 0–9)

## 2021-05-21 LAB — BASIC METABOLIC PANEL
BUN/Creatinine Ratio: 25 (ref 12–28)
BUN: 30 mg/dL — ABNORMAL HIGH (ref 8–27)
CO2: 25 mmol/L (ref 20–29)
Calcium: 9.9 mg/dL (ref 8.7–10.3)
Chloride: 104 mmol/L (ref 96–106)
Creatinine, Ser: 1.22 mg/dL — ABNORMAL HIGH (ref 0.57–1.00)
Glucose: 131 mg/dL — ABNORMAL HIGH (ref 70–99)
Potassium: 4.3 mmol/L (ref 3.5–5.2)
Sodium: 145 mmol/L — ABNORMAL HIGH (ref 134–144)
eGFR: 45 mL/min/{1.73_m2} — ABNORMAL LOW (ref >59)

## 2021-05-21 LAB — PRO B NATRIURETIC PEPTIDE: NT-Pro BNP: 653 pg/mL (ref 0–738)

## 2021-05-21 NOTE — Telephone Encounter (Signed)
Called and spoke to pt. Christine Hall went back over the instructions prior to cath.

## 2021-05-21 NOTE — Progress Notes (Signed)
ANA speckled pattern positive, negative for lupus or systemic sclerosis, polymyositis or dermatomyositis.  proBNP is normal, mild renal insufficiency, labs are stable for cardiac catheterization.

## 2021-05-26 ENCOUNTER — Ambulatory Visit (HOSPITAL_COMMUNITY)
Admission: RE | Disposition: A | Discharge status: Home / Self Care | Payer: Self-pay | Source: Home / Self Care | Attending: Cardiology

## 2021-05-26 ENCOUNTER — Ambulatory Visit (HOSPITAL_COMMUNITY)
Admission: RE | Admit: 2021-05-26 | Discharge: 2021-05-26 | Disposition: A | Discharge status: Home / Self Care | Payer: Medicare Other | Attending: Cardiology | Admitting: Cardiology

## 2021-05-26 ENCOUNTER — Other Ambulatory Visit: Payer: Self-pay | Admitting: Cardiology

## 2021-05-26 ENCOUNTER — Telehealth: Payer: Self-pay | Admitting: Cardiology

## 2021-05-26 ENCOUNTER — Other Ambulatory Visit: Payer: Self-pay

## 2021-05-26 DIAGNOSIS — R0789 Other chest pain: Secondary | ICD-10-CM

## 2021-05-26 DIAGNOSIS — I110 Hypertensive heart disease with heart failure: Secondary | ICD-10-CM | POA: Insufficient documentation

## 2021-05-26 DIAGNOSIS — R0609 Other forms of dyspnea: Secondary | ICD-10-CM | POA: Insufficient documentation

## 2021-05-26 DIAGNOSIS — I5032 Chronic diastolic (congestive) heart failure: Secondary | ICD-10-CM | POA: Diagnosis not present

## 2021-05-26 DIAGNOSIS — I272 Pulmonary hypertension, unspecified: Secondary | ICD-10-CM | POA: Diagnosis not present

## 2021-05-26 DIAGNOSIS — R072 Precordial pain: Secondary | ICD-10-CM

## 2021-05-26 HISTORY — PX: RIGHT/LEFT HEART CATH AND CORONARY ANGIOGRAPHY: CATH118266

## 2021-05-26 LAB — POCT I-STAT 7, (LYTES, BLD GAS, ICA,H+H)
Acid-base deficit: 1 mmol/L (ref 0.0–2.0)
Acid-base deficit: 1 mmol/L (ref 0.0–2.0)
Bicarbonate: 24.6 mmol/L (ref 20.0–28.0)
Bicarbonate: 24.6 mmol/L (ref 20.0–28.0)
Calcium, Ion: 1.22 mmol/L (ref 1.15–1.40)
Calcium, Ion: 1.28 mmol/L (ref 1.15–1.40)
HCT: 37 % (ref 36.0–46.0)
HCT: 38 % (ref 36.0–46.0)
Hemoglobin: 12.6 g/dL (ref 12.0–15.0)
Hemoglobin: 12.9 g/dL (ref 12.0–15.0)
O2 Saturation: 73 %
O2 Saturation: 99 %
Potassium: 4 mmol/L (ref 3.5–5.1)
Potassium: 4.1 mmol/L (ref 3.5–5.1)
Sodium: 142 mmol/L (ref 135–145)
Sodium: 143 mmol/L (ref 135–145)
TCO2: 26 mmol/L (ref 22–32)
TCO2: 26 mmol/L (ref 22–32)
pCO2 arterial: 44.3 mmHg (ref 32.0–48.0)
pCO2 arterial: 45.4 mmHg (ref 32.0–48.0)
pH, Arterial: 7.343 — ABNORMAL LOW (ref 7.350–7.450)
pH, Arterial: 7.352 (ref 7.350–7.450)
pO2, Arterial: 168 mmHg — ABNORMAL HIGH (ref 83.0–108.0)
pO2, Arterial: 41 mmHg — ABNORMAL LOW (ref 83.0–108.0)

## 2021-05-26 LAB — POCT I-STAT EG7
Acid-base deficit: 2 mmol/L (ref 0.0–2.0)
Bicarbonate: 23.6 mmol/L (ref 20.0–28.0)
Calcium, Ion: 1.17 mmol/L (ref 1.15–1.40)
HCT: 37 % (ref 36.0–46.0)
Hemoglobin: 12.6 g/dL (ref 12.0–15.0)
O2 Saturation: 80 %
Potassium: 3.9 mmol/L (ref 3.5–5.1)
Sodium: 144 mmol/L (ref 135–145)
TCO2: 25 mmol/L (ref 22–32)
pCO2, Ven: 43.6 mmHg — ABNORMAL LOW (ref 44.0–60.0)
pH, Ven: 7.34 (ref 7.250–7.430)
pO2, Ven: 47 mmHg — ABNORMAL HIGH (ref 32.0–45.0)

## 2021-05-26 LAB — CBC
HCT: 43.9 % (ref 36.0–46.0)
Hemoglobin: 13.7 g/dL (ref 12.0–15.0)
MCH: 28.8 pg (ref 26.0–34.0)
MCHC: 31.2 g/dL (ref 30.0–36.0)
MCV: 92.2 fL (ref 80.0–100.0)
Platelets: 183 10*3/uL (ref 150–400)
RBC: 4.76 MIL/uL (ref 3.87–5.11)
RDW: 12.9 % (ref 11.5–15.5)
WBC: 6.8 10*3/uL (ref 4.0–10.5)
nRBC: 0 % (ref 0.0–0.2)

## 2021-05-26 SURGERY — RIGHT/LEFT HEART CATH AND CORONARY ANGIOGRAPHY
Anesthesia: LOCAL

## 2021-05-26 MED ORDER — VERAPAMIL HCL 2.5 MG/ML IV SOLN
INTRAVENOUS | Status: AC
  Filled 2021-05-26: qty 2

## 2021-05-26 MED ORDER — DIPHENHYDRAMINE HCL 25 MG PO CAPS: 50.0000 mg | ORAL_CAPSULE | Freq: Once | ORAL | Status: AC

## 2021-05-26 MED ORDER — SODIUM CHLORIDE 0.9 % WEIGHT BASED INFUSION
3.0000 mL/kg/h | INTRAVENOUS | Status: AC
  Administered 2021-05-26: 3 mL/kg/h via INTRAVENOUS

## 2021-05-26 MED ORDER — MIDAZOLAM HCL 2 MG/2ML IJ SOLN
INTRAMUSCULAR | Status: DC | PRN
  Administered 2021-05-26: 2 mg via INTRAVENOUS

## 2021-05-26 MED ORDER — SODIUM CHLORIDE 0.9% FLUSH: 3.0000 mL | Freq: Two times a day (BID) | INTRAVENOUS | Status: DC

## 2021-05-26 MED ORDER — HEPARIN (PORCINE) IN NACL 1000-0.9 UT/500ML-% IV SOLN
INTRAVENOUS | Status: DC | PRN
  Administered 2021-05-26 (×2): 500 mL

## 2021-05-26 MED ORDER — SODIUM CHLORIDE 0.9 % WEIGHT BASED INFUSION: 1.0000 mL/kg/h | INTRAVENOUS | Status: DC

## 2021-05-26 MED ORDER — SODIUM CHLORIDE 0.9 % IV SOLN: 250.0000 mL | INTRAVENOUS | Status: DC | PRN

## 2021-05-26 MED ORDER — FAMOTIDINE 20 MG PO TABS
40.0000 mg | ORAL_TABLET | Freq: Once | ORAL | Status: AC
  Administered 2021-05-26: 40 mg via ORAL
  Filled 2021-05-26: qty 2

## 2021-05-26 MED ORDER — HEPARIN SODIUM (PORCINE) 1000 UNIT/ML IJ SOLN
INTRAMUSCULAR | Status: AC
  Filled 2021-05-26: qty 10

## 2021-05-26 MED ORDER — MIDAZOLAM HCL 2 MG/2ML IJ SOLN
INTRAMUSCULAR | Status: AC
  Filled 2021-05-26: qty 2

## 2021-05-26 MED ORDER — METHYLPREDNISOLONE SODIUM SUCC 125 MG IJ SOLR
125.0000 mg | Freq: Once | INTRAMUSCULAR | Status: AC
Start: 2021-05-26 — End: 2021-05-26
  Administered 2021-05-26: 125 mg via INTRAVENOUS
  Filled 2021-05-26: qty 2

## 2021-05-26 MED ORDER — SODIUM CHLORIDE 0.9% FLUSH: 3.0000 mL | INTRAVENOUS | Status: DC | PRN

## 2021-05-26 MED ORDER — VERAPAMIL HCL 2.5 MG/ML IV SOLN
INTRAVENOUS | Status: DC | PRN
  Administered 2021-05-26: 4 mL via INTRA_ARTERIAL

## 2021-05-26 MED ORDER — FENTANYL CITRATE (PF) 100 MCG/2ML IJ SOLN
INTRAMUSCULAR | Status: AC
  Filled 2021-05-26: qty 2

## 2021-05-26 MED ORDER — LIDOCAINE HCL (PF) 1 % IJ SOLN
INTRAMUSCULAR | Status: DC | PRN
  Administered 2021-05-26 (×2): 3 mL

## 2021-05-26 MED ORDER — ASPIRIN 81 MG PO CHEW
81.0000 mg | CHEWABLE_TABLET | ORAL | Status: AC
  Administered 2021-05-26: 81 mg via ORAL
  Filled 2021-05-26: qty 1

## 2021-05-26 MED ORDER — LIDOCAINE HCL (PF) 1 % IJ SOLN
INTRAMUSCULAR | Status: AC
  Filled 2021-05-26: qty 30

## 2021-05-26 MED ORDER — HEPARIN (PORCINE) IN NACL 1000-0.9 UT/500ML-% IV SOLN
INTRAVENOUS | Status: AC
  Filled 2021-05-26: qty 1000

## 2021-05-26 MED ORDER — FENTANYL CITRATE (PF) 100 MCG/2ML IJ SOLN
INTRAMUSCULAR | Status: DC | PRN
  Administered 2021-05-26: 25 ug via INTRAVENOUS

## 2021-05-26 MED ORDER — DIPHENHYDRAMINE HCL 50 MG/ML IJ SOLN
50.0000 mg | Freq: Once | INTRAMUSCULAR | Status: AC
  Administered 2021-05-26: 50 mg via INTRAVENOUS
  Filled 2021-05-26: qty 1

## 2021-05-26 SURGICAL SUPPLY — 12 items
CATH BALLN WEDGE 5F 110CM (CATHETERS) ×1 IMPLANT
CATH OPTITORQUE TIG 4.0 5F (CATHETERS) ×1 IMPLANT
DEVICE RAD TR BAND REGULAR (VASCULAR PRODUCTS) ×1 IMPLANT
GLIDESHEATH SLEND A-KIT 6F 22G (SHEATH) ×1 IMPLANT
GUIDEWIRE .025 260CM (WIRE) ×1 IMPLANT
GUIDEWIRE INQWIRE 1.5J.035X260 (WIRE) IMPLANT
INQWIRE 1.5J .035X260CM (WIRE) ×2
KIT HEART LEFT (KITS) ×3 IMPLANT
PACK CARDIAC CATHETERIZATION (CUSTOM PROCEDURE TRAY) ×3 IMPLANT
SHEATH GLIDE SLENDER 4/5FR (SHEATH) ×1 IMPLANT
TRANSDUCER W/STOPCOCK (MISCELLANEOUS) ×3 IMPLANT
TUBING CIL FLEX 10 FLL-RA (TUBING) ×3 IMPLANT

## 2021-05-26 NOTE — Telephone Encounter (Signed)
I called the patient and discussed with her regarding cardiac catheterization findings, also discussed with her husband earlier, as she has had RV strain by echocardiogram, continued dyspnea and atypical chest pain, given normal cardiac work-up, normal right heart catheterization, I have set her up for CT angiogram of the chest to evaluate for pulmonary embolism.  She may need pulmonary consultation.  BNP has been normal and LVEDP was normal today on 05/26/2021 by right and left heart catheterization.

## 2021-05-26 NOTE — Interval H&P Note (Signed)
History and Physical Interval Note:  05/26/2021 12:48 PM  Christine Hall  has presented today for surgery, with the diagnosis of DOE.  The various methods of treatment have been discussed with the patient and family. After consideration of risks, benefits and other options for treatment, the patient has consented to  Procedure(s): RIGHT/LEFT HEART CATH AND CORONARY ANGIOGRAPHY (N/A) and possible angioplasty Cath Lab Visit (complete for each Cath Lab visit)  Clinical Evaluation Leading to the Procedure:   ACS: No.  Non-ACS:    Anginal Classification: CCS III  Anti-ischemic medical therapy: Minimal Therapy (1 class of medications)  Non-Invasive Test Results: No non-invasive testing performed  Prior CABG: No previous CABG  as a surgical intervention.  The patient's history has been reviewed, patient examined, no change in status, stable for surgery.  I have reviewed the patient's chart and labs.  Questions were answered to the patient's satisfaction.     Adrian Prows

## 2021-05-27 ENCOUNTER — Encounter (HOSPITAL_COMMUNITY): Payer: Self-pay | Admitting: Cardiology

## 2021-05-27 MED FILL — Heparin Sodium (Porcine) Inj 1000 Unit/ML: INTRAMUSCULAR | Qty: 10 | Status: AC

## 2021-06-10 ENCOUNTER — Encounter: Payer: Self-pay | Admitting: Cardiology

## 2021-06-10 ENCOUNTER — Other Ambulatory Visit: Payer: Self-pay

## 2021-06-10 ENCOUNTER — Ambulatory Visit: Payer: Medicare Other | Admitting: Cardiology

## 2021-06-10 VITALS — BP 150/62 | HR 51 | Temp 97.9°F | Resp 17 | Ht 64.0 in | Wt 154.0 lb

## 2021-06-10 DIAGNOSIS — R0609 Other forms of dyspnea: Secondary | ICD-10-CM

## 2021-06-10 DIAGNOSIS — R002 Palpitations: Secondary | ICD-10-CM

## 2021-06-10 DIAGNOSIS — I10 Essential (primary) hypertension: Secondary | ICD-10-CM

## 2021-06-10 MED ORDER — OLMESARTAN MEDOXOMIL-HCTZ 20-12.5 MG PO TABS: 1.0000 | ORAL_TABLET | ORAL | 2 refills | Status: DC

## 2021-06-10 NOTE — Progress Notes (Signed)
Primary Physician/Referring:  Georgann Housekeeper, MD  Patient ID: Christine Hall, female    DOB: March 23, 1941, 81 y.o.   MRN: [id number]  Chief Complaint  Patient presents with   Follow-up    2 weeks   post cath   HPI:    Christine Hall  is a 81 y.o. Caucasian female patient who is very active and lives independently and was doing well until October 2022, suddenly started having marked dyspnea on exertion and marked fatigue even with minimal activity and also leg edema.  She was treated with diuretics, lower extremity duplex was negative for DVT, echocardiogram obtained revealed moderate TR and moderate pulmonary hypertension with RV strain and she is now referred to me for evaluation of dyspnea on exertion.  Due to marked dyspnea on exertion, atypical chest pain and palpitations, she underwent right and left heart catheterization due to abnormal echocardiogram revealing right heart strain.    She is also started to feel better over the past 4 to 6 weeks gradually.  She feels her energy level is returned back to close to normal but she still has mild exertional dyspnea.  She has not had any further chest pain.  She still continues to have palpitations mostly noted at night.   Past Medical History:  Diagnosis Date   Basal cell carcinoma 09/11/2008   left inner eye tx mohs   BCC (basal cell carcinoma of skin) 10/18/2013   left cheek tx mohs   Hypertension    Past Surgical History:  Procedure Laterality Date   LAPAROSCOPIC CHOLECYSTECTOMY  2002   macular pucker Left 2012   RIGHT/LEFT HEART CATH AND CORONARY ANGIOGRAPHY N/A 05/26/2021   Procedure: RIGHT/LEFT HEART CATH AND CORONARY ANGIOGRAPHY;  Surgeon: Yates Decamp, MD;  Location: [facility];  Service: Cardiovascular;  Laterality: N/A;   TOTAL ABDOMINAL HYSTERECTOMY  1982   Family History  Problem Relation Age of Onset   Heart attack Mother    Heart attack Father    Colon cancer Father 42   Atrial fibrillation  Sister    Heart attack Brother     Social History   Tobacco Use   Smoking status: Never   Smokeless tobacco: Never  Substance Use Topics   Alcohol use: No   Marital Status: Married  ROS  Review of Systems  Constitutional: Negative for malaise/fatigue.  Cardiovascular:  Positive for dyspnea on exertion. Negative for chest pain and leg swelling.  Gastrointestinal:  Negative for melena.  Objective  Blood pressure (!) 150/62, pulse (!) 51, temperature 97.9 F (36.6 C), temperature source Temporal, resp. rate 17, height 5\' 4"  (1.626 m), weight 154 lb (69.9 kg), SpO2 100 %. Body mass index is 26.43 kg/m.  Vitals with BMI 06/10/2021 06/10/2021 05/26/2021  Height - 5\' 4"  -  Weight - 154 lbs -  BMI - 26.42 -  Systolic 150 156 05/28/2021  Diastolic 62 54 36  Pulse 51 94 84    Physical Exam Neck:     Vascular: JVD present. No carotid bruit.  Cardiovascular:     Rate and Rhythm: Normal rate and regular rhythm. Extrasystoles are present.    Pulses: Intact distal pulses.     Heart sounds: Normal heart sounds. No murmur heard.   No gallop.  Pulmonary:     Effort: Pulmonary effort is normal.     Breath sounds: Normal breath sounds.  Abdominal:     General: Bowel sounds are normal.     Palpations: Abdomen is soft.  Musculoskeletal:     Right lower leg: No edema.     Left lower leg: No edema.     Laboratory examination:   External labs:   05/21/2020:  ANA speckled pattern positive, negative for lupus or systemic sclerosis, polymyositis or dermatomyositis.  proBNP is normal, mild renal insufficiency, labs are stable for cardiac catheterization.  Cholesterol, total 215.000 m 05/21/2020 HDL 91.000 mg 05/21/2020 LDL 109.000 m 05/21/2020 Triglycerides 82.000 mg 05/21/2020  Hemoglobin 12.900 g/d 03/17/2021 Hematocrit 39.1 Platelets 174  BUN 20 Creatinine, Serum 1.040 mg/ 03/17/2021 eGFR >60 Potassium 4.500 mg/ 03/17/2021 ALT (SGPT) 12.000 U/L 05/21/2020  TSH 1.680 03/17/2021  BNP  227.500 P 03/17/2021  Medications and allergies   Allergies  Allergen Reactions   Bystolic [Nebivolol Hcl] Diarrhea, Nausea Only and Swelling   Codeine Nausea Only   Other Nausea And Vomiting    All seafood   Plendil [Felodipine]     Increase BP, leg selling   Iodine Rash    fever   Latex Rash    redness   Neosporin [Neomycin-Bacitracin Zn-Polymyx] Rash    redness   Tape Rash    Medication list after today's encounter   Current Outpatient Medications  Medication Instructions   COVID-19 mRNA bivalent vaccine, Moderna, (MODERNA COVID-19 BIVAL BOOSTER) 50 MCG/0.5ML injection Intramuscular   influenza vaccine adjuvanted (FLUAD) 0.5 ML injection Intramuscular   metoprolol succinate (TOPROL-XL) 25 mg, Oral, Daily, Take with or immediately following a meal.   olmesartan-hydrochlorothiazide (BENICAR HCT) 20-12.5 MG tablet 1 tablet, Oral, BH-each morning   Vitamin D 2,000 Units, Oral, Daily    Radiology:   No results found.  Cardiac Studies:   Echocardiogram 04/01/2021:    1. Normal LV systolic function with visual EF 60-65%. Left ventricle cavity is normal in size. Normal left ventricular wall thickness. Normal global wall motion. Normal diastolic filling pattern, normal LAP. 2. Right atrial cavity is mildly dilated. 3. Right ventricle cavity is slightly dilated. Normal right ventricular function. 4. Mild (Grade I) mitral regurgitation. 5. Moderate tricuspid regurgitation. Moderate pulmonary hypertension. RVSP measures 58 mmHg.  Bilateral lower extremity venous duplex 04/29/2011: No evidence of bilateral lower extremity DVT.  Right and left heart catheterization 05/26/2021: RA 7/7, mean 4 mmHg RV 28/1, EDP 6 mmHg PA 29/10, mean 18 mmHg PW 8/9, mean 8 mmHg. RA saturation 80%, PA saturation 73%, aortic saturation 99%. QP/QS 0.73.  CO 6.54, CI 3.76, Normal.  LV: 124/3, EDP 13 mmHg.  Ao 118/56, mean 81 mmHg.  No pressure gradient across the aortic valve. LV:  Hyperdynamic LV, EF 80%. LM: Large vessel, normal. LAD: Large vessel, gives origin to large D1.  Smooth and normal. LCx: Very small, nondominant.  Smooth and normal. RCA: Very large.  Dominant.  Normal.  Impression: Dyspnea on exertion probably related to hyperdynamic LV and diastolic dysfunction.  Pulmonary pressures are within normal limits.  Normal coronary arteries, right dominant circulation.  30 mL contrast utilized.  EKG:   EKG 05/07/2021: Normal sinus rhythm at rate of 69 bpm, right atrial enlargement, normal axis.  Incomplete right bundle branch block.  Poor R wave progression, cannot exclude anteroseptal infarct old.  Normal QT interval.    Assessment     ICD-10-CM   1. DOE (dyspnea on exertion)  R06.09     2. Primary hypertension  I10 olmesartan-hydrochlorothiazide (BENICAR HCT) 20-12.5 MG tablet    3. Palpitations  R00.2        Medications Discontinued During This Encounter  Medication Reason   lisinopril (  ZESTRIL) 20 MG tablet Change in therapy   furosemide (LASIX) 20 MG tablet Change in therapy   spironolactone (ALDACTONE) 25 MG tablet Discontinued by provider   olmesartan-hydrochlorothiazide (BENICAR HCT) 20-12.5 MG tablet Reorder    Meds ordered this encounter  Medications   olmesartan-hydrochlorothiazide (BENICAR HCT) 20-12.5 MG tablet    Sig: Take 1 tablet by mouth every morning.    Dispense:  30 tablet    Refill:  2   No orders of the defined types were placed in this encounter.  Recommendations:   Christine Hall is a 81 y.o. Caucasian female patient who is very active and lives independently and was doing well until October 2022, suddenly started having marked dyspnea on exertion and marked fatigue even with minimal activity and also leg edema.  She was treated with diuretics, lower extremity duplex was negative for DVT, echocardiogram obtained revealed moderate TR and moderate pulmonary hypertension with RV strain and she is now referred to me for  evaluation of dyspnea on exertion.  Due to marked dyspnea on exertion, atypical chest pain and palpitations, she underwent right and left heart catheterization due to abnormal echocardiogram revealing right heart strain.  Fortunately right heart catheterization was normal and coronary angiography revealed hyperdynamic LVEF with normal EDP.  Normal coronary arteries.  I extensively reviewed with the patient regarding the findings.  Today there is no clinical evidence of heart failure.  She is also started to feel better over the past 4 to 6 weeks gradually.  She feels her energy level is returned back to close to normal but she still has mild exertional dyspnea.  She is very sensitive to the medications.  She is presently on lisinopril and she is continued with furosemide, on her last office visit I had switched her to olmesartan HCT along with spironolactone for probable diastolic heart failure and hypertension.  Will discontinue lisinopril and also discontinue furosemide for now and switch her to olmesartan HCT.  If she tolerates this, she will gradually ease into starting metoprolol succinate for palpitations which I suspect is due to PACs and PVCs.  Due to markedly abnormal EKG revealing right atrial strain and also echocardiogram revealing RV strain, pulmonary embolism which still needs to be excluded as her symptoms were relatively new in onset after her orthopedic surgery.  I had ordered CT scan of the chest for PE protocol, will follow up on this.  I would like to see her back in 6 weeks for follow-up.  Today there is no clinical evidence of heart failure, she does have ectopy.  She has abnormal ANA but probably nonspecific.   Adrian Prows, MD, [facility] 06/10/2021, 10:27 AM Office: [phone]

## 2021-06-11 ENCOUNTER — Telehealth: Payer: Self-pay

## 2021-06-11 NOTE — Telephone Encounter (Signed)
Phone call to patient to review instructions for 13 hr prep for CT w/ contrast on 06/16/21 at 1440. Prescription called into [place]. Pt aware and verbalized understanding of instructions. Prescription: 06/16/21 1:40 AM- 50mg  Prednisone 06/16/21 7:40 AM- 50mg  Prednisone 06/16/21 1:40 PM - 50mg  Prednisone and 50mg  Benadryl   Benadryl also called in as a prescription, per pts request.

## 2021-06-15 ENCOUNTER — Telehealth: Payer: Self-pay

## 2021-06-15 NOTE — Telephone Encounter (Signed)
She can increase Metoprolol succinate to 50 mg from 25 mg and continue Lisinopril and furosemide

## 2021-06-16 ENCOUNTER — Ambulatory Visit
Admission: RE | Admit: 2021-06-16 | Discharge: 2021-06-16 | Disposition: A | Discharge status: Home / Self Care | Payer: Medicare Other | Source: Ambulatory Visit | Attending: Cardiology | Admitting: Cardiology

## 2021-06-16 DIAGNOSIS — R072 Precordial pain: Secondary | ICD-10-CM

## 2021-06-16 DIAGNOSIS — R0609 Other forms of dyspnea: Secondary | ICD-10-CM

## 2021-06-16 MED ORDER — IOPAMIDOL (ISOVUE-370) INJECTION 76%
60.0000 mL | Freq: Once | INTRAVENOUS | Status: AC | PRN
  Administered 2021-06-16: 60 mL via INTRAVENOUS

## 2021-06-16 NOTE — Progress Notes (Signed)
CT angiogram chest PE protocol 06/16/2021: 1. Cardiovascular: Pulmonary arterial opacification is adequate without evidence of emboli. There is mild thoracic aortic atherosclerosis without aneurysm. Mild LAD coronary atherosclerosis is noted. The heart is normal in size. There is no pericardial effusion. 2. No pleural effusion or pneumothorax. Mild biapical pleuroparenchymal lung scarring. Multiple pulmonary nodules. Most severe: 6 mm left lower lobe solid pulmonary nodule. Recommend a non-contrast Chest CT at 3-6 months.  3. Aortic Atherosclerosis.  Please let her know that it is better for a pulmonary doctor to see her for her shortness of breath as the CT is mildly abnormal with nodules in lung. If she is willing I will place orders.

## 2021-06-16 NOTE — Telephone Encounter (Signed)
Spoke to patient she stated last Wednesday when you spoke to her she made you aware she had not started on metoprolol yet and she has not started on it at all do you still want her to take 50 mg and patient also asked due to her medication allergies is it ok for her to take furosemide along with metoprolol because you also told her that the furosemide is probably too strong for her please advise

## 2021-06-16 NOTE — Telephone Encounter (Signed)
Furosemide and lisinopril that she has restarted. Also start Metoprolol only 25 mg and see how she does as she has not started Metoprolol at all. I do not want 50 mg

## 2021-06-17 NOTE — Telephone Encounter (Signed)
Spoke to patient agreed to start metoprolol 25 mg and monitor for any side effects.

## 2021-06-18 ENCOUNTER — Ambulatory Visit: Payer: Medicare Other | Admitting: Student

## 2021-06-18 ENCOUNTER — Encounter: Payer: Self-pay | Admitting: Student

## 2021-06-18 ENCOUNTER — Other Ambulatory Visit: Payer: Self-pay

## 2021-06-18 VITALS — BP 152/54 | HR 51 | Temp 98.0°F | Resp 17 | Ht 64.0 in | Wt 154.6 lb

## 2021-06-18 DIAGNOSIS — R072 Precordial pain: Secondary | ICD-10-CM

## 2021-06-18 DIAGNOSIS — R911 Solitary pulmonary nodule: Secondary | ICD-10-CM

## 2021-06-18 DIAGNOSIS — T887XXA Unspecified adverse effect of drug or medicament, initial encounter: Secondary | ICD-10-CM

## 2021-06-18 DIAGNOSIS — R0609 Other forms of dyspnea: Secondary | ICD-10-CM

## 2021-06-18 MED ORDER — LISINOPRIL 40 MG PO TABS: 40.0000 mg | ORAL_TABLET | Freq: Every day | ORAL | 3 refills | Status: DC

## 2021-06-18 NOTE — Telephone Encounter (Signed)
Pt called back today and stated that she took the metoprolol 25 mg for the first time last night before bed. She stated that 2 hours later her tongue began to swell and she got a stomach ache. She said she was dizzy and nauseas and still does not feel well. Pts BP was 101/45 and she is unsure of what to do. I advised the pt to hold the Metoprolol for now until we call her back. Please advise.

## 2021-06-18 NOTE — Progress Notes (Signed)
° °Primary Physician/Referring:  Husain, Karrar, MD ° °Patient ID: Christine Hall, female    DOB: 03/02/1941, 80 y.o.   MRN: [id number] ° °Chief Complaint  °Patient presents with  ° Medication Reaction  ° °HPI:   ° °Christine Hall  is a 80 y.o. Caucasian female patient who is very active and lives independently and was doing well until October 2022, suddenly started having marked dyspnea on exertion and marked fatigue even with minimal activity and also leg edema.  She was treated with diuretics, lower extremity duplex was negative for DVT, echocardiogram obtained revealed moderate TR and moderate pulmonary hypertension with RV strain and she was referred to our office for evaluation of dyspnea on exertion. ° °Patient was last seen in our office 06/10/2021 at which time Dr. Ganji had switched her from lisinopril to olmesartan/hydrochlorothiazide.  Patient started the medication and subsequently called the office with concerns of leg swelling, headache, and face swelling.  Patient was advised to switch back to lisinopril 20 mg daily and continue Lasix 20 mg 3 times per week.  She was also advised to start metoprolol succinate 25 mg once daily.  Yesterday patient took first dose of metoprolol and about 2 hours later her tongue started swelling, she developed abdominal pain and nausea, as well as diarrhea. ° °Patient presents for urgent visit for further evaluation of medication reaction and management of hypertension.  At today's visit tongue swelling has resolved and she is feeling better overall. ° °Past Medical History:  °Diagnosis Date  ° Basal cell carcinoma 09/11/2008  ° left inner eye tx mohs  ° BCC (basal cell carcinoma of skin) 10/18/2013  ° left cheek tx mohs  ° Hypertension   ° °Past Surgical History:  °Procedure Laterality Date  ° LAPAROSCOPIC CHOLECYSTECTOMY  2002  ° macular pucker Left 2012  ° RIGHT/LEFT HEART CATH AND CORONARY ANGIOGRAPHY N/A 05/26/2021  ° Procedure: RIGHT/LEFT HEART CATH AND CORONARY  ANGIOGRAPHY;  Surgeon: Ganji, Jay, MD;  Location: [facility];  Service: Cardiovascular;  Laterality: N/A;  ° TOTAL ABDOMINAL HYSTERECTOMY  1982  ° °Family History  °Problem Relation Age of Onset  ° Heart attack Mother   ° Heart attack Father   ° Colon cancer Father 80  ° Atrial fibrillation Sister   ° Heart attack Brother   °  °Social History  ° °Tobacco Use  ° Smoking status: Never  ° Smokeless tobacco: Never  °Substance Use Topics  ° Alcohol use: No  ° °Marital Status: Married  °ROS  °Review of Systems  °Constitutional: Negative for malaise/fatigue.  °Cardiovascular:  Positive for dyspnea on exertion. Negative for chest pain and leg swelling.  °Gastrointestinal:  Negative for melena.  °Objective  °Blood pressure (!) 152/54, pulse (!) 51, temperature 98 °F (36.7 °C), temperature source Temporal, resp. rate 17, height 5' 4" (1.626 m), weight 154 lb 9.6 oz (70.1 kg), SpO2 100 %. Body mass index is 26.54 kg/m².  °Vitals with BMI 06/18/2021 06/18/2021 06/10/2021  °Height - 5' 4" -  °Weight - 154 lbs 10 oz -  °BMI - 26.52 -  °Systolic 152 154 150  °Diastolic 54 81 62  °Pulse 51 65 51  °  °Physical Exam °Vitals reviewed.  °HENT:  °   Mouth/Throat:  °   Comments: No tongue swelling °Neck:  °   Vascular: JVD present. No carotid bruit.  °Cardiovascular:  °   Rate and Rhythm: Normal rate and regular rhythm. Extrasystoles are present. °   Pulses:   Pulses: Intact distal pulses.     Heart sounds: Normal heart sounds. No murmur heard.   No gallop.  Pulmonary:     Effort: Pulmonary effort is normal.     Breath sounds: Normal breath sounds.  Abdominal:     General: Bowel sounds are normal.     Tenderness: There is no abdominal tenderness.  Musculoskeletal:     Right lower leg: No edema.     Left lower leg: No edema.     Laboratory examination:   External labs:   05/21/2020:  ANA speckled pattern positive, negative for lupus or systemic sclerosis, polymyositis or dermatomyositis.  proBNP is normal, mild renal  insufficiency, labs are stable for cardiac catheterization.  Cholesterol, total 215.000 m 05/21/2020 HDL 91.000 mg 05/21/2020 LDL 109.000 m 05/21/2020 Triglycerides 82.000 mg 05/21/2020  Hemoglobin 12.900 g/d 03/17/2021 Hematocrit 39.1 Platelets 174  BUN 20 Creatinine, Serum 1.040 mg/ 03/17/2021 eGFR >60 Potassium 4.500 mg/ 03/17/2021 ALT (SGPT) 12.000 U/L 05/21/2020  TSH 1.680 03/17/2021  BNP 227.500 P 03/17/2021  Allergies   Allergies  Allergen Reactions   Bystolic [Nebivolol Hcl] Diarrhea, Nausea Only and Swelling   Codeine Nausea Only   Other Nausea And Vomiting    All seafood   Plendil [Felodipine]     Increase BP, leg selling   Iodine Rash    fever   Latex Rash    redness   Neosporin [Neomycin-Bacitracin Zn-Polymyx] Rash    redness   Tape Rash    Medication list after today's encounter   Current Outpatient Medications  Medication Instructions   COVID-19 mRNA bivalent vaccine, Moderna, (MODERNA COVID-19 BIVAL BOOSTER) 50 MCG/0.5ML injection Intramuscular   furosemide (LASIX) 20 mg, Oral, 3 times weekly.   influenza vaccine adjuvanted (FLUAD) 0.5 ML injection Intramuscular   lisinopril (ZESTRIL) 40 mg, Oral, Daily   Vitamin D 2,000 Units, Oral, Daily    Radiology:   No results found.  Cardiac Studies:   Echocardiogram 04/01/2021:    1. Normal LV systolic function with visual EF 60-65%. Left ventricle cavity is normal in size. Normal left ventricular wall thickness. Normal global wall motion. Normal diastolic filling pattern, normal LAP. 2. Right atrial cavity is mildly dilated. 3. Right ventricle cavity is slightly dilated. Normal right ventricular function. 4. Mild (Grade I) mitral regurgitation. 5. Moderate tricuspid regurgitation. Moderate pulmonary hypertension. RVSP measures 58 mmHg.  Bilateral lower extremity venous duplex 04/29/2011: No evidence of bilateral lower extremity DVT.  Right and left heart catheterization 05/26/2021: RA 7/7, mean 4  mmHg RV 28/1, EDP 6 mmHg PA 29/10, mean 18 mmHg PW 8/9, mean 8 mmHg. RA saturation 80%, PA saturation 73%, aortic saturation 99%. QP/QS 0.73.  CO 6.54, CI 3.76, Normal.  LV: 124/3, EDP 13 mmHg.  Ao 118/56, mean 81 mmHg.  No pressure gradient across the aortic valve. LV: Hyperdynamic LV, EF 80%. LM: Large vessel, normal. LAD: Large vessel, gives origin to large D1.  Smooth and normal. LCx: Very small, nondominant.  Smooth and normal. RCA: Very large.  Dominant.  Normal.  Impression: Dyspnea on exertion probably related to hyperdynamic LV and diastolic dysfunction.  Pulmonary pressures are within normal limits.  Normal coronary arteries, right dominant circulation.  30 mL contrast utilized.  EKG:   EKG 05/07/2021: Normal sinus rhythm at rate of 69 bpm, right atrial enlargement, normal axis.  Incomplete right bundle branch block.  Poor R wave progression, cannot exclude anteroseptal infarct old.  Normal QT interval.    Assessment     ICD-10-CM  1. DOE (dyspnea on exertion)  R06.09 Ambulatory referral to Pulmonology  °  °2. Pulmonary nodule  R91.1 Ambulatory referral to Pulmonology  °  °3. Precordial pain  R07.2 Basic metabolic panel  °  °4. Non-dose-related adverse reaction to medication, initial encounter  T88.7XXA   °  °  ° °Medications Discontinued During This Encounter  °Medication Reason  ° olmesartan-hydrochlorothiazide (BENICAR HCT) 20-12.5 MG tablet   ° metoprolol succinate (TOPROL-XL) 25 MG 24 hr tablet Side effect (s)  ° lisinopril (ZESTRIL) 20 MG tablet Duplicate  °  °Meds ordered this encounter  °Medications  ° lisinopril (ZESTRIL) 40 MG tablet  °  Sig: Take 1 tablet (40 mg total) by mouth daily.  °  Dispense:  90 tablet  °  Refill:  3  ° °Orders Placed This Encounter  °Procedures  ° Basic metabolic panel  ° Ambulatory referral to Pulmonology  °  Referral Priority:   Routine  °  Referral Type:   Consultation  °  Referral Reason:   Specialty Services Required  °  Requested Specialty:    Pulmonary Disease  °  Number of Visits Requested:   1  ° °Recommendations:  ° °Christine Hall is a 80 y.o. Caucasian female patient who is very active and lives independently and was doing well until October 2022, suddenly started having marked dyspnea on exertion and marked fatigue even with minimal activity and also leg edema.  She was treated with diuretics, lower extremity duplex was negative for DVT, echocardiogram obtained revealed moderate TR and moderate pulmonary hypertension with RV strain and she was referred to our office for evaluation of dyspnea on exertion. ° °Patient presents for urgent follow-up as she has been unable to tolerate olmesartan/hydrochlorothiazide and developed tongue swelling last night with metoprolol.  I reviewed and discussed with patient results of CTA, which showed no pulmonary embolism but did reveal lung nodules.  We will send pulmonary referral for further evaluation.  Suspect patient's dyspnea is a factorial including diastolic dysfunction, underlying pulmonary pathology, and deconditioning as patient is relatively sedentary. ° °Given multiple medication intolerances we will hold off on new medications at this time.  Instead we will increase lisinopril, which she has tolerated for a long period of time from 20 mg to 40 mg daily.  We will repeat BMP in 1 week. ° °Patient does note she continues to have palpitations, which improved yesterday when she took metoprolol.  Patient remains anxious regarding management of hypertension as well as palpitations, therefore shared decision was for her to keep follow-up appointment next month with our office for reevaluation of hypertension control as well as palpitations.  Could consider diltiazem at next office visit. ° °Follow up as previously scheduled next month.  ° °Patient was seen in collaboration with Dr. Ganji and he is in agreement with the plan. ° ° °Celeste C Cantwell, PA-C °06/18/2021, 3:27 PM °Office: [phone] °

## 2021-06-18 NOTE — Telephone Encounter (Signed)
Pt coming in today 06/18/2021 at 2:30 pm to see CC.

## 2021-06-18 NOTE — Telephone Encounter (Signed)
Bring her in to be seen

## 2021-06-18 NOTE — Progress Notes (Signed)
Will send referral to pulmonary medicine for dyspnea and abnormal CT

## 2021-06-18 NOTE — Progress Notes (Signed)
Spoke with patient, yes she would like to see the pulmonologist. Please place orders.

## 2021-06-18 NOTE — Progress Notes (Signed)
Will place consult for pulmonary medicine for dyspnea and abnormal Chest CT

## 2021-06-19 ENCOUNTER — Other Ambulatory Visit: Payer: Self-pay

## 2021-06-19 MED ORDER — FUROSEMIDE 20 MG PO TABS: 20.0000 mg | ORAL_TABLET | Freq: Every day | ORAL | 2 refills | Status: DC

## 2021-06-24 ENCOUNTER — Telehealth: Payer: Self-pay

## 2021-06-24 MED ORDER — LISINOPRIL 40 MG PO TABS: 20.0000 mg | ORAL_TABLET | Freq: Every day | ORAL | 3 refills | Status: DC

## 2021-06-24 MED ORDER — HYDROCHLOROTHIAZIDE 12.5 MG PO CAPS: 12.5000 mg | ORAL_CAPSULE | Freq: Every day | ORAL | 3 refills | Status: DC

## 2021-06-24 NOTE — Telephone Encounter (Signed)
Given concern of diarrhea with increase lisinopril dosing, advised patient to reduce lisinopril back to 20 mg daily.  We will switch furosemide to hydrochlorothiazide 12.5 mg p.o. daily and repeat BMP in 1 week.

## 2021-06-24 NOTE — Telephone Encounter (Signed)
Patient called to say since increasing her meds she is having bad Diarrhea

## 2021-07-03 ENCOUNTER — Ambulatory Visit: Payer: Medicare Other | Admitting: Cardiology

## 2021-07-04 LAB — BASIC METABOLIC PANEL
BUN/Creatinine Ratio: 21 (ref 12–28)
BUN: 25 mg/dL (ref 8–27)
CO2: 23 mmol/L (ref 20–29)
Calcium: 9 mg/dL (ref 8.7–10.3)
Chloride: 103 mmol/L (ref 96–106)
Creatinine, Ser: 1.18 mg/dL — ABNORMAL HIGH (ref 0.57–1.00)
Glucose: 84 mg/dL (ref 70–99)
Potassium: 4.7 mmol/L (ref 3.5–5.2)
Sodium: 141 mmol/L (ref 134–144)
eGFR: 47 mL/min/{1.73_m2} — ABNORMAL LOW (ref >59)

## 2021-07-08 NOTE — Progress Notes (Signed)
Called and spoke with patient, she will continue current dose of Lisinopril 20mg  and she will be seeing her PCP within the next week and will let them know.

## 2021-07-08 NOTE — Progress Notes (Signed)
Called and spoke with patient regarding her lab results. Patient stated that you recently had her cut it in half, because she had a reaction. So, she is not understanding why you are saying "continue increased dose".  As far as blood pressures at home they have been running from 102/62 up to 120/54, and she feels fine other than severe diarrhea.  Please advise.

## 2021-07-08 NOTE — Progress Notes (Signed)
She may continue current dose of lisinopril. Blood pressure readings look good. Patient had tolerated this lisinopril dose (20 mg daily) previously according to our records. Has she been evaluated by PCP or GI for diarrhea, would recommend it.

## 2021-07-13 ENCOUNTER — Other Ambulatory Visit: Payer: Self-pay

## 2021-07-13 ENCOUNTER — Ambulatory Visit (INDEPENDENT_AMBULATORY_CARE_PROVIDER_SITE_OTHER): Payer: Medicare Other | Admitting: Pulmonary Disease

## 2021-07-13 ENCOUNTER — Encounter: Payer: Self-pay | Admitting: Pulmonary Disease

## 2021-07-13 VITALS — BP 166/62 | HR 84 | Temp 97.7°F | Ht 64.0 in | Wt 152.4 lb

## 2021-07-13 DIAGNOSIS — R918 Other nonspecific abnormal finding of lung field: Secondary | ICD-10-CM

## 2021-07-13 DIAGNOSIS — R052 Subacute cough: Secondary | ICD-10-CM

## 2021-07-13 DIAGNOSIS — Z789 Other specified health status: Secondary | ICD-10-CM

## 2021-07-13 DIAGNOSIS — R0789 Other chest pain: Secondary | ICD-10-CM | POA: Diagnosis not present

## 2021-07-13 DIAGNOSIS — R0602 Shortness of breath: Secondary | ICD-10-CM | POA: Diagnosis not present

## 2021-07-13 NOTE — Patient Instructions (Addendum)
Thank you for visiting Dr. Valeta Harms at [facility]. ?Today we recommend the following: ? ?Orders Placed This Encounter  ?Procedures  ? CT Super D Chest Wo Contrast  ? ?Repeat CT Chest in 3 months, see [place] afterwards  ? ?Return in about 3 months (around 10/13/2021) for with Eric Form, NP, or Dr. Valeta Harms. ? ? ? ?Please do your part to reduce the spread of COVID-19.  ?

## 2021-07-13 NOTE — Progress Notes (Signed)
Synopsis: Referred in March 2023 for abnormal CT chest by Wenda Low, MD  Subjective:   PATIENT ID: Christine Hall GENDER: female DOB: 1940-11-12, MRN: [id number]  Chief Complaint  Patient presents with   Consult    Consult. Patient is here to talk about pulmonary nodule.     This is a 81 year old female, past medical history of hypertension, known lifelong non-smoker.  Patient was seen for evaluation which started back in the fall for shortness of breath.  She recognized an event in which after they had a new HVAC system placed in the house she was over event when the fan cut on and blew a significant amount of dust into her face which she inhaled.  She remembers after that having a continued shortness of breath chest tightness associated following the event.  This lingered for several weeks involving a cough.  As well as breathlessness while she was doing things around her house and her regular activities of daily living.  Of note her mother and 2 other siblings have a diagnosis of asthma dating back many many years.  She does not like taking medications if possible.  During this evaluation for her shortness of breath she ended up having a CT of the chest.  The CT of the chest revealed 2 dominant pulmonary nodules 1 within the right upper lobe small subcentimeter in size as well as a 6 mm left lower lobe pleural-based nodule.  Patient was referred for evaluation of the pulmonary nodules seen on CT imaging.     Past Medical History:  Diagnosis Date   Basal cell carcinoma 09/11/2008   left inner eye tx mohs   BCC (basal cell carcinoma of skin) 10/18/2013   left cheek tx mohs   Hypertension      Family History  Problem Relation Age of Onset   Heart attack Mother    Heart attack Father    Colon cancer Father 39   Atrial fibrillation Sister    Heart attack Brother      Past Surgical History:  Procedure Laterality Date   LAPAROSCOPIC CHOLECYSTECTOMY  2002   macular pucker  Left 2012   RIGHT/LEFT HEART CATH AND CORONARY ANGIOGRAPHY N/A 05/26/2021   Procedure: RIGHT/LEFT HEART CATH AND CORONARY ANGIOGRAPHY;  Surgeon: Adrian Prows, MD;  Location: [facility];  Service: Cardiovascular;  Laterality: N/A;   TOTAL ABDOMINAL HYSTERECTOMY  1982    Social History   Socioeconomic History   Marital status: Married    Spouse name: Not on file   Number of children: 0   Years of education: Not on file   Highest education level: Not on file  Occupational History   Not on file  Tobacco Use   Smoking status: Never   Smokeless tobacco: Never  Vaping Use   Vaping Use: Never used  Substance and Sexual Activity   Alcohol use: No   Drug use: No   Sexual activity: Not on file  Other Topics Concern   Not on file  Social History Narrative   2 adopted children   Social Determinants of Health   Financial Resource Strain: Not on file  Food Insecurity: Not on file  Transportation Needs: Not on file  Physical Activity: Not on file  Stress: Not on file  Social Connections: Not on file  Intimate Partner Violence: Not on file     Allergies  Allergen Reactions   Bystolic [Nebivolol Hcl] Diarrhea, Nausea Only and Swelling   Codeine Nausea Only  Metoprolol     Tongue swelling, diarrhea, GI upset    Olmesartan Medoxomil-Hctz     Dizziness, facial swelling    Other Nausea And Vomiting    All seafood   Plendil [Felodipine]     Increase BP, leg selling   Iodine Rash    fever   Latex Rash    redness   Neosporin [Neomycin-Bacitracin Zn-Polymyx] Rash    redness   Tape Rash     Outpatient Medications Prior to Visit  Medication Sig Dispense Refill   Cholecalciferol (VITAMIN D) 50 MCG (2000 UT) CAPS Take 2,000 Units by mouth daily.     COVID-19 mRNA bivalent vaccine, Moderna, (MODERNA COVID-19 BIVAL BOOSTER) 50 MCG/0.5ML injection Inject into the muscle. 0.5 mL 0   hydrochlorothiazide (MICROZIDE) 12.5 MG capsule Take 1 capsule (12.5 mg total) by mouth daily. 30  capsule 3   influenza vaccine adjuvanted (FLUAD) 0.5 ML injection Inject into the muscle. 0.5 mL 0   lisinopril (ZESTRIL) 40 MG tablet Take 0.5 tablets (20 mg total) by mouth daily. 90 tablet 3   No facility-administered medications prior to visit.    Review of Systems  Constitutional:  Negative for chills, fever, malaise/fatigue and weight loss.  HENT:  Negative for hearing loss, sore throat and tinnitus.   Eyes:  Negative for blurred vision and double vision.  Respiratory:  Negative for cough, hemoptysis, sputum production, shortness of breath, wheezing and stridor.   Cardiovascular:  Negative for chest pain, palpitations, orthopnea, leg swelling and PND.  Gastrointestinal:  Negative for abdominal pain, constipation, diarrhea, heartburn, nausea and vomiting.  Genitourinary:  Negative for dysuria, hematuria and urgency.  Musculoskeletal:  Negative for joint pain and myalgias.  Skin:  Negative for itching and rash.  Neurological:  Negative for dizziness, tingling, weakness and headaches.  Endo/Heme/Allergies:  Negative for environmental allergies. Does not bruise/bleed easily.  Psychiatric/Behavioral:  Negative for depression. The patient is not nervous/anxious and does not have insomnia.   All other systems reviewed and are negative.   Objective:  Physical Exam Vitals reviewed.  Constitutional:      General: She is not in acute distress.    Appearance: She is well-developed.  HENT:     Head: Normocephalic and atraumatic.  Eyes:     General: No scleral icterus.    Conjunctiva/sclera: Conjunctivae normal.     Pupils: Pupils are equal, round, and reactive to light.  Neck:     Vascular: No JVD.     Trachea: No tracheal deviation.  Cardiovascular:     Rate and Rhythm: Normal rate and regular rhythm.     Heart sounds: Normal heart sounds. No murmur heard. Pulmonary:     Effort: Pulmonary effort is normal. No tachypnea, accessory muscle usage or respiratory distress.     Breath  sounds: No stridor. No wheezing, rhonchi or rales.  Abdominal:     General: There is no distension.     Palpations: Abdomen is soft.     Tenderness: There is no abdominal tenderness.  Musculoskeletal:        General: No tenderness.     Cervical back: Neck supple.  Lymphadenopathy:     Cervical: No cervical adenopathy.  Skin:    General: Skin is warm and dry.     Capillary Refill: Capillary refill takes less than 2 seconds.     Findings: No rash.  Neurological:     Mental Status: She is alert and oriented to person, place, and time.  Psychiatric:  Behavior: Behavior normal.     Vitals:   07/13/21 1035  BP: (!) 166/62  Pulse: 84  Temp: 97.7 F (36.5 C)  TempSrc: Oral  SpO2: 98%  Weight: 152 lb 6.4 oz (69.1 kg)  Height: '5\' 4"'$  (1.626 m)   98% on RA BMI Readings from Last 3 Encounters:  07/13/21 26.16 kg/m  06/18/21 26.54 kg/m  06/10/21 26.43 kg/m   Wt Readings from Last 3 Encounters:  07/13/21 152 lb 6.4 oz (69.1 kg)  06/18/21 154 lb 9.6 oz (70.1 kg)  06/10/21 154 lb (69.9 kg)     CBC    Component Value Date/Time   WBC 6.8 05/26/2021 1038   RBC 4.76 05/26/2021 1038   HGB 12.6 05/26/2021 1306   HCT 37.0 05/26/2021 1306   PLT 183 05/26/2021 1038   MCV 92.2 05/26/2021 1038   MCH 28.8 05/26/2021 1038   MCHC 31.2 05/26/2021 1038   RDW 12.9 05/26/2021 1038    Chest Imaging: 06/16/2021: CT chest: Left lower lobe 6 mm pulmonary nodule, small right upper lobe pulmonary nodule. The patient's images have been independently reviewed by me.    Pulmonary Functions Testing Results: No flowsheet data found.  FeNO:   Pathology:   Echocardiogram:   Heart Catheterization:     Assessment & Plan:     ICD-10-CM   1. Multiple lung nodules  R91.8 CT Super D Chest Wo Contrast    2. Non-smoker  Z78.9     3. Chest tightness  R07.89     4. SOB (shortness of breath)  R06.02     5. Subacute cough  R05.2       Discussion:  This is a 81 year old female  with a evaluation for shortness of breath and subacute cough following a known dust particulate inhalation event.  She is a family history of asthma in her mother and siblings.  Patient had a proximately 6 to 8-week course after this event of insidious cough and shortness of breath.  This has since resolved but this was the first time that she remembers something lasting this long.  Additionally she states that she feels breathless at times when she is exposed to damp air.  No other significant history except for occasional seasonal allergies.  Plan: As for her ongoing cough symptoms lisinopril could be a potential cause.  She does feel like her cough is better.  I suspect it was related to what ever respiratory event she had back in the fall that she has been recovering from.  She could very well have adult onset asthma symptoms however this is unlikely.  She does however have a family history. I would recommend monitoring her symptoms.  If she routinely comes back to having flareups throughout the year or cough that remains persistent then trialing a maintenance inhaler would be appropriate. She is agreeable to this plan. Also if her cough stays persistent then trying to switch to a different blood pressure medication would be a good alternative as well.  She needs a repeat noncontrasted CT scan of the chest in 3 months to ensure nodule stability.  If the nodules are stable then we will continue to follow him with surveillance.  If they change I explained that would be the indication in which we would need to consider options such as biopsy.  Patient is agreeable to this plan.  Return to clinic in 3 months after repeat noncontrasted CT scan of the chest.   Current Outpatient Medications:    Cholecalciferol (  VITAMIN D) 50 MCG (2000 UT) CAPS, Take 2,000 Units by mouth daily., Disp: , Rfl:    COVID-19 mRNA bivalent vaccine, Moderna, (MODERNA COVID-19 BIVAL BOOSTER) 50 MCG/0.5ML injection, Inject into  the muscle., Disp: 0.5 mL, Rfl: 0   hydrochlorothiazide (MICROZIDE) 12.5 MG capsule, Take 1 capsule (12.5 mg total) by mouth daily., Disp: 30 capsule, Rfl: 3   influenza vaccine adjuvanted (FLUAD) 0.5 ML injection, Inject into the muscle., Disp: 0.5 mL, Rfl: 0   lisinopril (ZESTRIL) 40 MG tablet, Take 0.5 tablets (20 mg total) by mouth daily., Disp: 90 tablet, Rfl: 3  I spent 62 minutes dedicated to the care of this patient on the date of this encounter to include pre-visit review of records, face-to-face time with the patient discussing conditions above, post visit ordering of testing, clinical documentation with the electronic health record, making appropriate referrals as documented, and communicating necessary findings to members of the patients care team.   Garner Nash, DO [facility] 07/13/2021 12:11 PM

## 2021-07-24 ENCOUNTER — Other Ambulatory Visit: Payer: Self-pay

## 2021-07-24 ENCOUNTER — Encounter: Payer: Self-pay | Admitting: Cardiology

## 2021-07-24 ENCOUNTER — Ambulatory Visit: Payer: Medicare Other | Admitting: Cardiology

## 2021-07-24 VITALS — BP 156/55 | HR 62 | Temp 97.5°F | Resp 16 | Ht 64.0 in | Wt 153.8 lb

## 2021-07-24 DIAGNOSIS — I10 Essential (primary) hypertension: Secondary | ICD-10-CM

## 2021-07-24 DIAGNOSIS — R0609 Other forms of dyspnea: Secondary | ICD-10-CM

## 2021-07-24 DIAGNOSIS — I5032 Chronic diastolic (congestive) heart failure: Secondary | ICD-10-CM

## 2021-07-24 DIAGNOSIS — R002 Palpitations: Secondary | ICD-10-CM

## 2021-07-24 DIAGNOSIS — J4520 Mild intermittent asthma, uncomplicated: Secondary | ICD-10-CM

## 2021-07-24 NOTE — Progress Notes (Signed)
? ?Primary Physician/Referring:  Wenda Low, MD ? ?Patient ID: TAWAN CORKERN, female    DOB: 1940/07/23, 81 y.o.   MRN: [id number] ? ?Chief Complaint  ?Patient presents with  ? Shortness of Breath  ? Hypertension  ? Follow-up  ?  6 weeks  ? ?HPI:   ? ?Christine Hall  is a 81 y.o. Caucasian female patient who is very active and lives independently and was doing well until October 2022, suddenly started having marked dyspnea on exertion and marked fatigue even with minimal activity and also leg edema.  She was treated with diuretics, lower extremity duplex was negative for DVT, echocardiogram obtained revealed moderate TR and moderate pulmonary hypertension with RV strain and she was referred to our office for evaluation of dyspnea on exertion. ? ?Right and left heart catheterization January 2023 revealed normal coronary arteries, hyperdynamic LV, no evidence of pulmonary hypertension.  In view of persistent symptoms CT angiogram of the chest performed on 06/16/2021 revealed pulmonary nodules, negative for PE.  She has now been evaluated by pulmonary medicine, suspecting reactive airway disease as etiology for her dyspnea. ? ?This is a 55-month office visit, she still continues to have dyspnea but states that overall she is much improved compared to when she initially presented.  Leg edema is also improved.  But she still has persistent leg edema at the end of the day left leg worse than the right.  Denies any chest pain.  She continues to have occasional episodes of palpitations especially at night lasting a few seconds. ? ?Past Medical History:  ?Diagnosis Date  ? Basal cell carcinoma 09/11/2008  ? left inner eye tx mohs  ? BCC (basal cell carcinoma of skin) 10/18/2013  ? left cheek tx mohs  ? Hypertension   ? ?Past Surgical History:  ?Procedure Laterality Date  ? LAPAROSCOPIC CHOLECYSTECTOMY  2002  ? macular pucker Left 2012  ? RIGHT/LEFT HEART CATH AND CORONARY ANGIOGRAPHY N/A 05/26/2021  ? Procedure:  RIGHT/LEFT HEART CATH AND CORONARY ANGIOGRAPHY;  Surgeon: Adrian Prows, MD;  Location: [facility];  Service: Cardiovascular;  Laterality: N/A;  ? TOTAL ABDOMINAL HYSTERECTOMY  1982  ? ?Family History  ?Problem Relation Age of Onset  ? Heart attack Mother   ? Heart attack Father   ? Colon cancer Father 45  ? Atrial fibrillation Sister   ? Heart attack Brother   ?  ?Social History  ? ?Tobacco Use  ? Smoking status: Never  ? Smokeless tobacco: Never  ?Substance Use Topics  ? Alcohol use: No  ? ?Marital Status: Married  ?ROS  ?Review of Systems  ?Cardiovascular:  Positive for dyspnea on exertion and palpitations. Negative for chest pain and leg swelling.  ?Respiratory:  Positive for wheezing.   ?Gastrointestinal:  Negative for melena.  ?Objective  ?Blood pressure (!) 156/55, pulse 62, temperature (!) 97.5 ?F (36.4 ?C), temperature source Temporal, resp. rate 16, height $RemoveBe'5\' 4"'IQLYssstu$  (1.626 m), weight 153 lb 12.8 oz (69.8 kg), SpO2 99 %. Body mass index is 26.4 kg/m?.  ?Vitals with BMI 07/24/2021 07/13/2021 06/18/2021  ?Height $Remove'5\' 4"'rKunNgw$  $RemoveB'5\' 4"'GpIPoxdz$  -  ?Weight 153 lbs 13 oz 152 lbs 6 oz -  ?BMI 26.39 26.15 -  ?Systolic [phone]  ?Diastolic 55 62 54  ?Pulse 62 84 51  ?  ?Physical Exam ?Vitals reviewed.  ?HENT:  ?   Mouth/Throat:  ?   Comments: No tongue swelling ?Neck:  ?   Vascular: No carotid bruit or JVD.  ?  Cardiovascular:  ?   Rate and Rhythm: Normal rate and regular rhythm. Extrasystoles are present. ?   Pulses: Intact distal pulses.  ?   Heart sounds: Normal heart sounds. No murmur heard. ?  No gallop.  ?Pulmonary:  ?   Effort: Pulmonary effort is normal.  ?   Breath sounds: Normal breath sounds.  ?Abdominal:  ?   General: Bowel sounds are normal.  ?   Palpations: Abdomen is soft.  ?Musculoskeletal:  ?   Right lower leg: No edema.  ?   Left lower leg: No edema.  ?  ?Laboratory examination:  ? ?External labs:  ? ?05/21/2020: ? ?ANA speckled pattern positive, negative for lupus or systemic sclerosis, polymyositis or  dermatomyositis. ? ?proBNP is normal, mild renal insufficiency, labs are stable for cardiac catheterization. ? ?Cholesterol, total 215.000 m 05/21/2020 ?HDL 91.000 mg 05/21/2020 ?LDL 109.000 m 05/21/2020 ?Triglycerides 82.000 mg 05/21/2020 ? ?Hemoglobin 12.900 g/d 03/17/2021 ?Hematocrit 39.1 ?Platelets 174 ? ?BUN 20 ?Creatinine, Serum 1.040 mg/ 03/17/2021 ?eGFR >60 ?Potassium 4.500 mg/ 03/17/2021 ?ALT (SGPT) 12.000 U/L 05/21/2020 ? ?TSH 1.680 03/17/2021 ? ?BNP 227.500 P 03/17/2021 ? ?Allergies  ? ?Allergies  ?Allergen Reactions  ? Bystolic [Nebivolol Hcl] Diarrhea, Nausea Only and Swelling  ? Codeine Nausea Only  ? Metoprolol   ?  Tongue swelling, diarrhea, GI upset   ? Olmesartan Medoxomil-Hctz   ?  Dizziness, facial swelling   ? Other Nausea And Vomiting  ?  All seafood  ? Plendil [Felodipine]   ?  Increase BP, leg selling  ? Iodine Rash  ?  fever  ? Latex Rash  ?  redness  ? Neosporin [Neomycin-Bacitracin Zn-Polymyx] Rash  ?  redness  ? Tape Rash  ?  ?Medication list after today's encounter  ? ?Current Outpatient Medications  ?Medication Instructions  ? hydrochlorothiazide (MICROZIDE) 12.5 mg, Oral, Daily  ? lisinopril (ZESTRIL) 20 mg, Oral, Daily  ? Vitamin D 2,000 Units, Oral, Daily  ? ?Radiology:  ? ?CT angiogram chest PE protocol 06/16/2021: ?1. Cardiovascular: Pulmonary arterial opacification is adequate without evidence of emboli. There is mild thoracic aortic atherosclerosis without aneurysm. Mild LAD coronary atherosclerosis is noted. The heart is normal in size. There is no pericardial effusion. ?2. No pleural effusion or pneumothorax. Mild biapical pleuroparenchymal lung scarring. Multiple pulmonary nodules. Most severe: 6 mm left lower lobe solid pulmonary nodule. Recommend a non-contrast Chest CT at 3-6 months.  ?3. Aortic Atherosclerosis. ?  ?Please let her know that it is better for a pulmonary doctor to see her for her shortness of breath as the CT is mildly abnormal with nodules in lung. If she is willing I  will place orders. ? ?Cardiac Studies:  ? ?Echocardiogram 04/01/2021:    ?1. Normal LV systolic function with visual EF 60-65%. Left ventricle cavity is normal in size. Normal left ?ventricular wall thickness. Normal global wall motion. Normal diastolic filling pattern, normal LAP. ?2. Right atrial cavity is mildly dilated. ?3. Right ventricle cavity is slightly dilated. Normal right ventricular function. ?4. Mild (Grade I) mitral regurgitation. ?5. Moderate tricuspid regurgitation. Moderate pulmonary hypertension. RVSP measures 58 mmHg. ? ?Bilateral lower extremity venous duplex 04/29/2011: ?No evidence of bilateral lower extremity DVT. ? ?Right and left heart catheterization 05/26/2021: ?RA 7/7, mean 4 mmHg ?RV 28/1, EDP 6 mmHg ?PA 29/10, mean 18 mmHg ?PW 8/9, mean 8 mmHg. ?RA saturation 80%, PA saturation 73%, aortic saturation 99%. ?QP/QS 0.73.  CO 6.54, CI 3.76, Normal. ? ?LV: 124/3, EDP 13 mmHg.  Ao  118/56, mean 81 mmHg.  No pressure gradient across the aortic valve. ?LV: Hyperdynamic LV, EF 80%. ?LM: Large vessel, normal. ?LAD: Large vessel, gives origin to large D1.  Smooth and normal. ?LCx: Very small, nondominant.  Smooth and normal. ?RCA: Very large.  Dominant.  Normal. ? ?Impression: Dyspnea on exertion probably related to hyperdynamic LV and diastolic dysfunction.  Pulmonary pressures are within normal limits.  Normal coronary arteries, right dominant circulation.  30 mL contrast utilized. ? ?EKG:  ? ?EKG 05/07/2021: Normal sinus rhythm at rate of 69 bpm, right atrial enlargement, normal axis.  Incomplete right bundle branch block.  Poor R wave progression, cannot exclude anteroseptal infarct old.  Normal QT interval.   ? ?Assessment  ? ?  ICD-10-CM   ?1. DOE (dyspnea on exertion)  R06.09 PCV ECHOCARDIOGRAM COMPLETE  ?  ?2. Chronic diastolic (congestive) heart failure (HCC)  I50.32 PCV ECHOCARDIOGRAM COMPLETE  ?  ?3. Primary hypertension  I10   ?  ?4. Palpitations  R00.2   ?  ?5. Mild intermittent  reactive airway disease without complication  D58.31   ?  ?  ? ?Medications Discontinued During This Encounter  ?Medication Reason  ? COVID-19 mRNA bivalent vaccine, Moderna, (MODERNA COVID-19 BIVAL BOOSTER) 50 MCG/0.5ML injection

## 2021-08-05 ENCOUNTER — Other Ambulatory Visit: Payer: Self-pay | Admitting: Internal Medicine

## 2021-08-05 DIAGNOSIS — Z1231 Encounter for screening mammogram for malignant neoplasm of breast: Secondary | ICD-10-CM

## 2021-08-18 ENCOUNTER — Ambulatory Visit
Admission: RE | Admit: 2021-08-18 | Discharge: 2021-08-18 | Disposition: A | Discharge status: Home / Self Care | Payer: Medicare Other | Source: Ambulatory Visit | Attending: Internal Medicine | Admitting: Internal Medicine

## 2021-08-18 DIAGNOSIS — Z1231 Encounter for screening mammogram for malignant neoplasm of breast: Secondary | ICD-10-CM

## 2021-08-20 ENCOUNTER — Other Ambulatory Visit: Payer: Self-pay | Admitting: Internal Medicine

## 2021-08-20 DIAGNOSIS — R928 Other abnormal and inconclusive findings on diagnostic imaging of breast: Secondary | ICD-10-CM

## 2021-08-26 ENCOUNTER — Ambulatory Visit: Payer: Medicare Other

## 2021-08-26 DIAGNOSIS — R0609 Other forms of dyspnea: Secondary | ICD-10-CM

## 2021-08-26 DIAGNOSIS — I5032 Chronic diastolic (congestive) heart failure: Secondary | ICD-10-CM

## 2021-09-02 ENCOUNTER — Encounter: Payer: Self-pay | Admitting: Cardiology

## 2021-09-02 ENCOUNTER — Ambulatory Visit: Payer: Medicare Other | Admitting: Cardiology

## 2021-09-02 VITALS — BP 134/72 | HR 44 | Temp 98.0°F | Resp 17 | Ht 64.0 in | Wt 150.6 lb

## 2021-09-02 DIAGNOSIS — R002 Palpitations: Secondary | ICD-10-CM

## 2021-09-02 DIAGNOSIS — I10 Essential (primary) hypertension: Secondary | ICD-10-CM

## 2021-09-02 DIAGNOSIS — I2729 Other secondary pulmonary hypertension: Secondary | ICD-10-CM

## 2021-09-02 DIAGNOSIS — I071 Rheumatic tricuspid insufficiency: Secondary | ICD-10-CM

## 2021-09-02 DIAGNOSIS — I493 Ventricular premature depolarization: Secondary | ICD-10-CM

## 2021-09-02 MED ORDER — VERAPAMIL HCL ER 120 MG PO TBCR: 180.0000 mg | EXTENDED_RELEASE_TABLET | Freq: Every evening | ORAL | 2 refills | Status: DC

## 2021-09-02 NOTE — Progress Notes (Signed)
? ?Primary Physician/Referring:  Wenda Low, MD ? ?Patient ID: Christine Hall, female    DOB: 1940-07-07, 81 y.o.   MRN: [id number] ? ?Chief Complaint  ?Patient presents with  ? Shortness of Breath  ? Hypertension  ?  6 weeks  ? ?HPI:   ? ?Christine Hall  is a 81 y.o.  Caucasian female patient who is very active and lives independently and was doing well until October 2022, suddenly started having marked dyspnea on exertion and marked fatigue even with minimal activity and also leg edema.  She was treated with diuretics, lower extremity duplex was negative for DVT, echocardiogram obtained revealed moderate TR and moderate pulmonary hypertension with RV strain.  ? ?Right and left heart catheterization January 2023 revealed normal coronary arteries, hyperdynamic LV, moderately elevated EDP and no evidence of pulmonary hypertension.  In view of persistent symptoms CT angiogram of the chest performed on 06/16/2021 revealed pulmonary nodules, negative for PE.  She has now been evaluated by pulmonary medicine, suspecting reactive airway disease as etiology for her dyspnea. ? ?Presently states that her dyspnea is improved.  She has not had any further leg edema.  Still does not feel like she is completely back to her baseline.  She has developed new frequent palpitations, this is a 6-week office visit. ? ?Past Medical History:  ?Diagnosis Date  ? Basal cell carcinoma 09/11/2008  ? left inner eye tx mohs  ? BCC (basal cell carcinoma of skin) 10/18/2013  ? left cheek tx mohs  ? Hypertension   ? ?Past Surgical History:  ?Procedure Laterality Date  ? LAPAROSCOPIC CHOLECYSTECTOMY  2002  ? macular pucker Left 2012  ? RIGHT/LEFT HEART CATH AND CORONARY ANGIOGRAPHY N/A 05/26/2021  ? Procedure: RIGHT/LEFT HEART CATH AND CORONARY ANGIOGRAPHY;  Surgeon: Adrian Prows, MD;  Location: [facility];  Service: Cardiovascular;  Laterality: N/A;  ? TOTAL ABDOMINAL HYSTERECTOMY  1982  ? ?Family History  ?Problem Relation Age of Onset   ? Heart attack Mother   ? Heart attack Father   ? Colon cancer Father 72  ? Atrial fibrillation Sister   ? Heart attack Brother   ?  ?Social History  ? ?Tobacco Use  ? Smoking status: Never  ? Smokeless tobacco: Never  ?Substance Use Topics  ? Alcohol use: No  ? ?Marital Status: Married  ?ROS  ?Review of Systems  ?Cardiovascular:  Positive for dyspnea on exertion and palpitations. Negative for chest pain and leg swelling.  ?Respiratory:  Positive for wheezing.   ?Gastrointestinal:  Negative for melena.  ?Objective  ?Blood pressure 134/72, pulse (!) 44, temperature 98 ?F (36.7 ?C), temperature source Temporal, resp. rate 17, height $RemoveBe'5\' 4"'BYElIDUHO$  (1.626 m), weight 150 lb 9.6 oz (68.3 kg), SpO2 98 %. Body mass index is 25.85 kg/m?.  ? ?  09/02/2021  ? 11:14 AM 07/24/2021  ? 11:00 AM 07/13/2021  ? 10:35 AM  ?Vitals with BMI  ?Height $Remove'5\' 4"'fQdYNwA$  $RemoveB'5\' 4"'rNPbeFVv$  $RemoveBe'5\' 4"'UbyYRorCE$   ?Weight 150 lbs 10 oz 153 lbs 13 oz 152 lbs 6 oz  ?BMI 25.84 26.39 26.15  ?Systolic 846 659 935  ?Diastolic 72 55 62  ?Pulse 44 62 84  ?  ?Physical Exam ?Vitals reviewed.  ?HENT:  ?   Mouth/Throat:  ?   Comments: No tongue swelling ?Neck:  ?   Vascular: No carotid bruit or JVD.  ?Cardiovascular:  ?   Rate and Rhythm: Normal rate and regular rhythm. FrequentExtrasystoles are present. ?   Pulses: Intact distal  pulses.  ?   Heart sounds: Normal heart sounds. No murmur heard. ?  No gallop.  ?Pulmonary:  ?   Effort: Pulmonary effort is normal.  ?   Breath sounds: Normal breath sounds.  ?Abdominal:  ?   General: Bowel sounds are normal.  ?   Palpations: Abdomen is soft.  ?Musculoskeletal:  ?   Right lower leg: No edema.  ?   Left lower leg: No edema.  ?  ?Laboratory examination:  ? ?External labs:  ? ?05/21/2020: ? ?ANA speckled pattern positive, negative for lupus or systemic sclerosis, polymyositis or dermatomyositis. ? ?proBNP is normal, mild renal insufficiency, labs are stable for cardiac catheterization. ? ?Cholesterol, total 215.000 m 05/21/2020 ?HDL 91.000 mg 05/21/2020 ?LDL 109.000  m 05/21/2020 ?Triglycerides 82.000 mg 05/21/2020 ? ?Hemoglobin 12.900 g/d 03/17/2021 ?Hematocrit 39.1 ?Platelets 174 ? ?BUN 20 ?Creatinine, Serum 1.040 mg/ 03/17/2021 ?eGFR >60 ?Potassium 4.500 mg/ 03/17/2021 ?ALT (SGPT) 12.000 U/L 05/21/2020 ? ?TSH 1.680 03/17/2021 ? ?BNP 227.500 P 03/17/2021 ? ?Allergies  ? ?Allergies  ?Allergen Reactions  ? Bystolic [Nebivolol Hcl] Diarrhea, Nausea Only and Swelling  ? Codeine Nausea Only  ? Metoprolol   ?  Tongue swelling, diarrhea, GI upset   ? Olmesartan Medoxomil-Hctz   ?  Dizziness, facial swelling   ? Other Nausea And Vomiting  ?  All seafood  ? Plendil [Felodipine]   ?  Increase BP, leg selling  ? Iodine Rash  ?  fever  ? Latex Rash  ?  redness  ? Neosporin [Neomycin-Bacitracin Zn-Polymyx] Rash  ?  redness  ? Tape Rash  ?  ?Medication list after today's encounter  ? ?Current Outpatient Medications  ?Medication Instructions  ? hydrochlorothiazide (MICROZIDE) 12.5 mg, Oral, Daily  ? lisinopril (ZESTRIL) 20 mg, Oral, Daily  ? pantoprazole (PROTONIX) 20 mg, Daily  ? verapamil (CALAN-SR) 180 mg, Oral, Every evening  ? Vitamin D 2,000 Units, Oral, Daily  ? ?Radiology:  ? ?CT angiogram chest PE protocol 06/16/2021: ?1. Cardiovascular: Pulmonary arterial opacification is adequate without evidence of emboli. There is mild thoracic aortic atherosclerosis without aneurysm. Mild LAD coronary atherosclerosis is noted. The heart is normal in size. There is no pericardial effusion. ?2. No pleural effusion or pneumothorax. Mild biapical pleuroparenchymal lung scarring. Multiple pulmonary nodules. Most severe: 6 mm left lower lobe solid pulmonary nodule. Recommend a non-contrast Chest CT at 3-6 months.  ?3. Aortic Atherosclerosis. ?  ?Please let her know that it is better for a pulmonary doctor to see her for her shortness of breath as the CT is mildly abnormal with nodules in lung. If she is willing I will place orders. ? ?Cardiac Studies:  ? ?Bilateral lower extremity venous duplex 04/29/2011: ?No  evidence of bilateral lower extremity DVT. ? ?Right and left heart catheterization 05/26/2021: ?RA 7/7, mean 4 mmHg ?RV 28/1, EDP 6 mmHg ?PA 29/10, mean 18 mmHg ?PW 8/9, mean 8 mmHg. ?RA saturation 80%, PA saturation 73%, aortic saturation 99%. ?QP/QS 0.73.  CO 6.54, CI 3.76, Normal. ? ?LV: 124/3, EDP 13 mmHg.  Ao 118/56, mean 81 mmHg.  No pressure gradient across the aortic valve. ?LV: Hyperdynamic LV, EF 80%. ?LM: Large vessel, normal. ?LAD: Large vessel, gives origin to large D1.  Smooth and normal. ?LCx: Very small, nondominant.  Smooth and normal. ?RCA: Very large.  Dominant.  Normal. ? ?Impression: Dyspnea on exertion probably related to hyperdynamic LV and diastolic dysfunction.  Pulmonary pressures are within normal limits.  Normal coronary arteries, right dominant circulation.  30 mL  contrast utilized. ? ?PCV ECHOCARDIOGRAM COMPLETE 08/26/2021  ?EKG: NSR w/ frequent PVCs. ?Normal LV systolic function with visual EF 55-60%. Left ventricle cavity is normal in size. Normal left ventricular wall thickness. Normal global wall motion. Doppler evidence of grade II (pseudonormal) diastolic dysfunction, elevated LAP. ?Left atrial cavity is mildly dilated. ?Right atrial cavity is mildly dilated. ?Mild (Grade I) aortic regurgitation. ?Mild (Grade I) mitral regurgitation. ?Moderate to severe tricuspid regurgitation. Moderate pulmonary hypertension. RVSP measures 55 mmHg. ?Compared to 04/01/2021 Grade 2 DD, elevated LAP, LAE, mild AR are new findings, Moderate TR is now moderate/severe, moderate PHTN may be under appreciated due to dilated RA size. Otherwise no significant change. Consider the frequent PVCs on current study as possible source of dyspnea as well. ? ? Six Minute Walk - 09/02/21 1243   ? ?  ? Six Minute Walk  ? Supplemental oxygen during test? No   ? Lap distance in meters  20 meters   ? Laps Completed  19   ? Baseline Heartrate 50   ? Baseline SPO2 100 %   ?  ? Interval Oxygen Saturation and HR   ? 2  Minute Oxygen Saturation % 98 %   ? 2 Minute HR 111   ? 4 Minute Oxygen Saturation % 100 %   ? 4 Minute HR 125   ? 6 Minute Oxygen Saturation % 100 %   ? 6 Minute HR 126   ?  ? End of Test Values  ? Hear

## 2021-09-04 ENCOUNTER — Ambulatory Visit
Admission: RE | Admit: 2021-09-04 | Discharge: 2021-09-04 | Disposition: A | Discharge status: Home / Self Care | Payer: Medicare Other | Source: Ambulatory Visit | Attending: Internal Medicine | Admitting: Internal Medicine

## 2021-09-04 ENCOUNTER — Other Ambulatory Visit: Payer: Self-pay | Admitting: Internal Medicine

## 2021-09-04 DIAGNOSIS — R921 Mammographic calcification found on diagnostic imaging of breast: Secondary | ICD-10-CM

## 2021-09-04 DIAGNOSIS — R928 Other abnormal and inconclusive findings on diagnostic imaging of breast: Secondary | ICD-10-CM

## 2021-09-07 ENCOUNTER — Telehealth: Payer: Self-pay

## 2021-09-07 DIAGNOSIS — I10 Essential (primary) hypertension: Secondary | ICD-10-CM

## 2021-09-07 NOTE — Telephone Encounter (Signed)
Patient stated that you recently prescribed Verapamil '180mg'$  her ankle and heel of the foot is swollen. She has also been having nausea and headaches. She doesn't know what to do because it has helped her heart rate. Please advise.   ?

## 2021-09-07 NOTE — Telephone Encounter (Signed)
Discontinue Verapamil 12- due to side effects. Change to Metoprolol succinate 50 mg daily

## 2021-09-08 MED ORDER — CLONIDINE 0.1 MG/24HR TD PTWK: 0.1000 mg | MEDICATED_PATCH | TRANSDERMAL | 3 refills | Status: DC

## 2021-09-08 NOTE — Telephone Encounter (Signed)
I sent a new Rx for a patch

## 2021-09-08 NOTE — Telephone Encounter (Signed)
Patient stated that she cannot take that because she had an allergic reaction and side effects (left side tongue swelling, diarrhea, nausea, and dizziness). Patient cannot take that. Please advise.

## 2021-09-08 NOTE — Telephone Encounter (Signed)
ICD-10-CM   ?1. Primary hypertension  I10 cloNIDine (CATAPRES - DOSED IN MG/24 HR) 0.1 mg/24hr patch  ?  ? ? ?Meds ordered this encounter  ?Medications  ? cloNIDine (CATAPRES - DOSED IN MG/24 HR) 0.1 mg/24hr patch  ?  Sig: Place 1 patch (0.1 mg total) onto the skin once a week.  ?  Dispense:  4 patch  ?  Refill:  3  ?  ?

## 2021-09-11 ENCOUNTER — Other Ambulatory Visit: Payer: Self-pay | Admitting: Cardiology

## 2021-09-11 ENCOUNTER — Encounter: Payer: Self-pay | Admitting: Cardiology

## 2021-09-11 NOTE — Telephone Encounter (Signed)
Patient called and left a VM stating that she is in extreme panic about taking the patch. She has picked it up, but started reading the side effects and wanted something else called in. Please advise.

## 2021-09-11 NOTE — Telephone Encounter (Signed)
Patient has multiple allergies.  She had called in stating her blood pressure is elevated, I had prescribed her clonidine but she is afraid to take it due to side effects. ? ?She is unable to take any beta-blockers due to what appears to be angioedema. ? ?For upcoming cardiac MR, advised her although verapamil was discontinued due to leg edema, she will take verapamil the previous night and the day of MR so she can help relatively low heart rate.  If you need to use intravenous verapamil, it should be fine to get the heart rate down as well. ? ?Overall blood pressure is well controlled without any other medications except hydrochlorothiazide and lisinopril, advised her that if she does not feel comfortable with Catapres patch, she can hold off on using it however if blood pressure is elevated she could certainly try it. ?

## 2021-09-30 ENCOUNTER — Telehealth (HOSPITAL_COMMUNITY): Payer: Self-pay | Admitting: Emergency Medicine

## 2021-09-30 NOTE — Telephone Encounter (Signed)
Reaching out to patient to offer assistance regarding upcoming cardiac imaging study; pt verbalizes understanding of appt date/time, parking situation and where to check in, pre-test NPO status and medications ordered, and verified current allergies; name and call back number provided for further questions should they arise Marchia Bond RN Navigator Cardiac Imaging [facility] [phone] office [phone] cell  Denies implants Denies claustro Denies iv issues Arrival 330

## 2021-10-01 ENCOUNTER — Ambulatory Visit (HOSPITAL_COMMUNITY)
Admission: RE | Admit: 2021-10-01 | Discharge: 2021-10-01 | Disposition: A | Discharge status: Home / Self Care | Payer: Medicare Other | Source: Ambulatory Visit | Attending: Cardiology | Admitting: Cardiology

## 2021-10-01 DIAGNOSIS — I071 Rheumatic tricuspid insufficiency: Secondary | ICD-10-CM | POA: Insufficient documentation

## 2021-10-01 DIAGNOSIS — I493 Ventricular premature depolarization: Secondary | ICD-10-CM | POA: Diagnosis present

## 2021-10-01 DIAGNOSIS — I2729 Other secondary pulmonary hypertension: Secondary | ICD-10-CM | POA: Diagnosis present

## 2021-10-01 DIAGNOSIS — R002 Palpitations: Secondary | ICD-10-CM | POA: Insufficient documentation

## 2021-10-01 MED ORDER — GADOBUTROL 1 MMOL/ML IV SOLN
7.0000 mL | Freq: Once | INTRAVENOUS | Status: AC | PRN
  Administered 2021-10-01: 7 mL via INTRAVENOUS

## 2021-10-03 NOTE — Progress Notes (Signed)
Cardiac MRI 10/02/2021: 1.  Normal LV size, wall thickness, and systolic function (EF 12%) 2.  Normal RV size and systolic function (EF 24%) 3.  No late gadolinium enhancement to suggest myocardial scar 4.  Moderate tricuspid regurgitation (regurgitant fraction 32%) 5.  Mild mitral regurgitation (regurgitant fraction 13%) 6.  Small pericardial effusion.  Discuss on OV soon. May need to suppress PVC.

## 2021-10-08 ENCOUNTER — Ambulatory Visit
Admission: RE | Admit: 2021-10-08 | Discharge: 2021-10-08 | Disposition: A | Discharge status: Home / Self Care | Payer: Medicare Other | Source: Ambulatory Visit | Attending: Pulmonary Disease | Admitting: Pulmonary Disease

## 2021-10-08 DIAGNOSIS — R918 Other nonspecific abnormal finding of lung field: Secondary | ICD-10-CM

## 2021-10-13 ENCOUNTER — Encounter: Payer: Self-pay | Admitting: Adult Health

## 2021-10-13 ENCOUNTER — Ambulatory Visit (INDEPENDENT_AMBULATORY_CARE_PROVIDER_SITE_OTHER): Payer: Medicare Other | Admitting: Adult Health

## 2021-10-13 VITALS — BP 124/68 | HR 60 | Temp 98.2°F | Ht 64.0 in | Wt 147.0 lb

## 2021-10-13 DIAGNOSIS — R918 Other nonspecific abnormal finding of lung field: Secondary | ICD-10-CM | POA: Diagnosis not present

## 2021-10-13 DIAGNOSIS — R0602 Shortness of breath: Secondary | ICD-10-CM | POA: Diagnosis not present

## 2021-10-13 DIAGNOSIS — J383 Other diseases of vocal cords: Secondary | ICD-10-CM | POA: Diagnosis not present

## 2021-10-13 DIAGNOSIS — R0609 Other forms of dyspnea: Secondary | ICD-10-CM

## 2021-10-13 NOTE — Assessment & Plan Note (Signed)
Dyspnea with exertion and decreased activity tolerance.  Along with a dry cough and intermittent wheezing.  May be multifactorial with underlying moderate tricuspid regurgitation and diastolic dysfunction. Could have a component of mild intermittent asthma. Check PFTs.  Plan  Patient Instructions  Recommend changing off of Lisinopril , would avoid ACE inhibitors. Discuss with cardiology at [place] visit .  Robitussin DM as needed for cough.  Claritin '5mg'$  At bedtime  for 2 weeks and then as needed  drainage.  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  CT chest in  1 year  Follow up with 6-8 weeks with PFTs with Dr. Valeta Harms or Aashi Derrington NP  Please contact office for sooner follow up if symptoms do not improve or worsen or seek emergency care

## 2021-10-13 NOTE — Assessment & Plan Note (Signed)
Scattered lung nodules noted on CT scan.  Patient is a never smoker.  Repeat CT chest shows stable lung nodules.  We will repeat CT chest without contrast in 1 year.

## 2021-10-13 NOTE — Assessment & Plan Note (Signed)
Over the last 6 to 9 months patient has noticed voice has become very shaky and [place].  Today on exam she does have some spastic characteristics.  She does have a very fine mild tremor of her hands.  She also has ongoing cough and wheezing with possible upper airway irritation. We will refer to ENT for evaluation of upper airway/vocal cords.  Would recommend discontinuation of ACE inhibitor as it may be causing upper airway irritation and destabilization along with chronic cough.  Add in medications for trigger prevention such as postnasal drainage and GERD. Patient does have a family history of familial tremor.  No known family history of Parkinson's.  Plan  Patient Instructions  Recommend changing off of Lisinopril , would avoid ACE inhibitors. Discuss with cardiology at [place] visit .  Robitussin DM as needed for cough.  Claritin '5mg'$  At bedtime  for 2 weeks and then as needed  drainage.  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  CT chest in  1 year  Follow up with 6-8 weeks with PFTs with Dr. Valeta Harms or Grayson Pfefferle NP  Please contact office for sooner follow up if symptoms do not improve or worsen or seek emergency care

## 2021-10-13 NOTE — Progress Notes (Signed)
$'@Patient'I$  ID: Christine Hall, female    DOB: 1940/07/19, 81 y.o.   MRN: [id number]  Chief Complaint  Patient presents with   Follow-up    Referring provider: Wenda Low, MD  HPI: 81 year old seen for pulmonary consult July 13, 2021 for abnormal CT chest with pulmonary nodules and shortness of breath and cough   TEST/EVENTS :  CT chest June 16, 2021 no evidence of pulmonary emboli or other acute abnormality in the chest. 2. Multiple pulmonary nodules. Most severe: 6 mm left lower lobe solid pulmonary nodule. Recommend a non-contrast Chest CT at 3-6 Months  79/10/2021 Follow up: Pulmonary nodules and Dyspnea /Cough  Patient presents for 2-monthfollow-up.  Patient was seen last visit for pulmonary consult.  Patient started having increased shortness of breath around October 2022.  Noticed that she was getting short of breath with minimal activities with decreased activity tolerance.  She was seen by cardiology, 2D echo showed moderate tricuspid regurgitation and moderate pulmonary hypertension with RV strain.  Venous Dopplers were negative for DVT.  She was treated for fluid overload with decompensated diastolic heart failure with diuretics.  She underwent a right and left heart cath completed on May 26, 2021.  This showed pulmonary pressures within normal limits.  Normal coronary arteries.  Felt dyspnea was related to diastolic dysfunction.  Patient was recommended to change lisinopril but was unable to tolerate multiple blood pressure medicines including metoprolol and Benicar.  She is now back on lisinopril. Patient says her shortness of breath has improved and her activity tolerance is improved she is not back to baseline but better than she was 6 months ago.  She says she continues to have ongoing minimally productive dry cough intermittent wheezing and whistling in her throat, postnasal drainage.  She says over the last year she has noticed that her voice has become very weak  and shaky at times.  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  She denies any difficulty swallowing.   CT chest June 16, 2021 showed no PE.  There was some scattered pulmonary nodules with a 6 mm left lower lobe solid pulmonary nodule.  She underwent a repeat CT chest on October 08, 2021.  This showed stable bilateral pulmonary nodules.  Left lower lobe subpleural nodule measuring 8 x 5 x 7 mm felt to be very similar to previous nodule.  And 11 x 4 x 3 mm in the left lower lobe similar.  And other small nodules include a 3 mm right lower lobe subpleural nodule and 8 mm and 4 mm right upper lobe nodule.  Patient is a never smoker.  We discussed her CT results.  Also discussed a repeat CT in 12 months.  Allergies  Allergen Reactions   Verapamil Other (See Comments)    Edema   Bystolic [Nebivolol Hcl] Diarrhea, Nausea Only and Swelling   Codeine Nausea Only   Metoprolol     Tongue swelling, diarrhea, GI upset    Olmesartan Medoxomil-Hctz     Dizziness, facial swelling    Other Nausea And Vomiting    All seafood   Plendil [Felodipine]     Increase BP, leg selling   Iodine Rash    fever   Latex Rash    redness   Neosporin [Neomycin-Bacitracin Zn-Polymyx] Rash    redness   Tape Rash    Immunization History  Administered Date(s) Administered   Fluad  Quad(high Dose 65+) 01/28/2021   Influenza Split 03/05/2010, 03/22/2011, 03/08/2012, 02/15/2013, 02/13/2014, 02/07/2015, 01/28/2021   Influenza, High Dose Seasonal PF 02/18/2013, 02/13/2014, 02/05/2015, 03/03/2016, 02/23/2017, 02/22/2018, 01/25/2019, 02/21/2020   Moderna Covid-19 Vaccine Bivalent Booster 32yr & up 02/17/2021   Moderna SARS-COV2 Booster Vaccination 03/11/2020   Moderna Sars-Covid-2 Vaccination 05/21/2019, 06/18/2019, 03/11/2020, 02/17/2021   Pneumococcal Conjugate-13 02/13/2014   Pneumococcal Polysaccharide-23 03/26/2009   Td 04/12/2006   Tdap  04/06/2016   Zoster, Live 04/04/2012    Past Medical History:  Diagnosis Date   Basal cell carcinoma 09/11/2008   left inner eye tx mohs   BCC (basal cell carcinoma of skin) 10/18/2013   left cheek tx mohs   Hypertension     Tobacco History: Social History   Tobacco Use  Smoking Status Never  Smokeless Tobacco Never   Counseling given: Not Answered   Outpatient Medications Prior to Visit  Medication Sig Dispense Refill   Cholecalciferol (VITAMIN D) 50 MCG (2000 UT) CAPS Take 2,000 Units by mouth daily.     hydrochlorothiazide (MICROZIDE) 12.5 MG capsule Take 1 capsule (12.5 mg total) by mouth daily. 30 capsule 3   lisinopril (ZESTRIL) 20 MG tablet Take 20 mg by mouth daily.     cloNIDine (CATAPRES - DOSED IN MG/24 HR) 0.1 mg/24hr patch Place 1 patch (0.1 mg total) onto the skin once a week. 4 patch 3   pantoprazole (PROTONIX) 20 MG tablet Take 20 mg by mouth daily. (Patient not taking: Reported on 09/02/2021)     No facility-administered medications prior to visit.     Review of Systems:   Constitutional:   No  weight loss, night sweats,  Fevers, chills, fatigue, or  lassitude.  HEENT:   No headaches,  Difficulty swallowing,  Tooth/dental problems, or  Sore throat,                No sneezing, itching, ear ache,  +nasal congestion, post nasal drip,   CV:  No chest pain,  Orthopnea, PND, swelling in lower extremities, anasarca, dizziness, palpitations, syncope.   GI  No heartburn, indigestion, abdominal pain, nausea, vomiting, diarrhea, change in bowel habits, loss of appetite, bloody stools.   Resp:  No chest wall deformity  Skin: no rash or lesions.  GU: no dysuria, change in color of urine, no urgency or frequency.  No flank pain, no hematuria   MS:  No joint pain or swelling.  No decreased range of motion.  No back pain.    Physical Exam  BP 124/68 (BP Location: Left Arm, Patient Position: Sitting, Cuff Size: Normal)   Pulse 60   Temp 98.2 F (36.8 C)  (Oral)   Ht '5\' 4"'$  (1.626 m)   Wt 147 lb (66.7 kg)   SpO2 99%   BMI 25.23 kg/m   GEN: A/Ox3; pleasant , NAD, well nourished , spastic speech    HEENT:  [place]/AT,   NOSE-clear, THROAT-clear, no lesions, no postnasal drip or exudate noted.   NECK:  Supple w/ fair ROM; no JVD; normal carotid impulses w/o bruits; no thyromegaly or nodules palpated; no lymphadenopathy.  No stridor   RESP  Clear  P & A; w/o, wheezes/ rales/ or rhonchi. no accessory muscle use, no dullness to percussion  CARD:  RRR, no m/r/g, no peripheral edema, pulses intact, no cyanosis or clubbing.  GI:   Soft & nt; nml bowel sounds; no organomegaly or masses detected.   Musco: Warm bil, no deformities or joint swelling noted.   Neuro:  alert, no focal deficits noted.    Skin: Warm, no lesions or rashes    Lab Results:    BMET  I       View : No data to display.          No results found for: NITRICOXIDE      Assessment & Plan:   Lung nodules Scattered lung nodules noted on CT scan.  Patient is a never smoker.  Repeat CT chest shows stable lung nodules.  We will repeat CT chest without contrast in 1 year.  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  Today on exam she does have some spastic characteristics.  She does have a very fine mild tremor of her hands.  She also has ongoing cough and wheezing with possible upper airway irritation. We will refer to ENT for evaluation of upper airway/vocal cords.  Would recommend discontinuation of ACE inhibitor as it may be causing upper airway irritation and destabilization along with chronic cough.  Add in medications for trigger prevention such as postnasal drainage and GERD. Patient does have a family history of familial tremor.  No known family history of Parkinson's.  Plan  Patient Instructions  Recommend changing off of Lisinopril , would avoid ACE inhibitors. Discuss with cardiology at  [place] visit .  Robitussin DM as needed for cough.  Claritin '5mg'$  At bedtime  for 2 weeks and then as needed  drainage.  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  CT chest in  1 year  Follow up with 6-8 weeks with PFTs with Dr. Valeta Harms or Lanisha Stepanian NP  Please contact office for sooner follow up if symptoms do not improve or worsen or seek emergency care            Dyspnea on exertion Dyspnea with exertion and decreased activity tolerance.  Along with a dry cough and intermittent wheezing.  May be multifactorial with underlying moderate tricuspid regurgitation and diastolic dysfunction. Could have a component of mild intermittent asthma. Check PFTs.  Plan  Patient Instructions  Recommend changing off of Lisinopril , would avoid ACE inhibitors. Discuss with cardiology at [place] visit .  Robitussin DM as needed for cough.  Claritin '5mg'$  At bedtime  for 2 weeks and then as needed  drainage.  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  CT chest in  1 year  Follow up with 6-8 weeks with PFTs with Dr. Valeta Harms or Disney Ruggiero NP  Please contact office for sooner follow up if symptoms do not improve or worsen or seek emergency care            I spent   48 minutes dedicated to the care of this patient on the date of this encounter to include pre-visit review of records, face-to-face time with the patient discussing conditions above, post visit ordering of testing, clinical documentation with the electronic health record, making appropriate referrals as documented, and communicating necessary findings to members of the patients care team.   Rexene Edison, NP 10/13/2021

## 2021-10-13 NOTE — Patient Instructions (Addendum)
Recommend changing off of Lisinopril , would avoid ACE inhibitors. Discuss with cardiology at [place] visit .  Robitussin DM as needed for cough.  Claritin '5mg'$  At bedtime  for 2 weeks and then as needed  drainage.  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  CT chest in  1 year  Follow up with 6-8 weeks with PFTs with Dr. Valeta Harms or Lylee Corrow NP  Please contact office for sooner follow up if symptoms do not improve or worsen or seek emergency care

## 2021-10-14 ENCOUNTER — Ambulatory Visit: Payer: Medicare Other | Admitting: Cardiology

## 2021-10-19 ENCOUNTER — Encounter: Payer: Self-pay | Admitting: Student

## 2021-10-19 ENCOUNTER — Ambulatory Visit: Payer: Medicare Other | Admitting: Student

## 2021-10-19 VITALS — BP 129/80 | HR 81 | Temp 98.0°F | Resp 17 | Ht 64.0 in | Wt 150.6 lb

## 2021-10-19 DIAGNOSIS — I493 Ventricular premature depolarization: Secondary | ICD-10-CM

## 2021-10-19 DIAGNOSIS — I071 Rheumatic tricuspid insufficiency: Secondary | ICD-10-CM

## 2021-10-19 DIAGNOSIS — R002 Palpitations: Secondary | ICD-10-CM

## 2021-10-19 MED ORDER — DILTIAZEM HCL ER COATED BEADS 120 MG PO CP24: 120.0000 mg | ORAL_CAPSULE | Freq: Every day | ORAL | 4 refills | Status: DC

## 2021-10-19 NOTE — Progress Notes (Signed)
Primary Physician/Referring:  Georgann Housekeeper, MD  Patient ID: Adrienne Mocha, female    DOB: 11-28-1940, 81 y.o.   MRN: [id number]  Chief Complaint  Patient presents with   Follow-up    6 WEEKS   Palpitations   Shortness of Breath   PULMONARY HYPERTENSION   HPI:    LORRIANE DEHART  is a 81 y.o.  Caucasian female patient who is very active and lives independently and was doing well until October 2022, suddenly started having marked dyspnea on exertion and marked fatigue even with minimal activity and also leg edema.  She was treated with diuretics, lower extremity duplex was negative for DVT, echocardiogram obtained revealed moderate TR and moderate pulmonary hypertension with RV strain.   Right and left heart catheterization January 2023 revealed normal coronary arteries, hyperdynamic LV, moderately elevated EDP and no evidence of pulmonary hypertension.  In view of persistent symptoms CT angiogram of the chest performed on 06/16/2021 revealed pulmonary nodules, negative for PE. Patient now follows with pulmonology who feels patient trembling voice over the last several months may be related to lisinopril and have advised changing patient's medications.   Patient presents today for follow up after recent cardiac MRI, details of which are below. She reports dyspnea and palpitations remain the same compared to last office visit.   She has been unable to tolerate beta blockers, verapamil or clonidine.   Past Medical History:  Diagnosis Date   Basal cell carcinoma 09/11/2008   left inner eye tx mohs   BCC (basal cell carcinoma of skin) 10/18/2013   left cheek tx mohs   Hypertension    Past Surgical History:  Procedure Laterality Date   LAPAROSCOPIC CHOLECYSTECTOMY  2002   macular pucker Left 2012   RIGHT/LEFT HEART CATH AND CORONARY ANGIOGRAPHY N/A 05/26/2021   Procedure: RIGHT/LEFT HEART CATH AND CORONARY ANGIOGRAPHY;  Surgeon: Yates Decamp, MD;  Location: [facility];   Service: Cardiovascular;  Laterality: N/A;   TOTAL ABDOMINAL HYSTERECTOMY  1982   Family History  Problem Relation Age of Onset   Heart attack Mother    Heart attack Father    Colon cancer Father 4   Atrial fibrillation Sister    Heart attack Brother     Social History   Tobacco Use   Smoking status: Never   Smokeless tobacco: Never  Substance Use Topics   Alcohol use: No   Marital Status: Married  ROS  Review of Systems  Cardiovascular:  Positive for dyspnea on exertion (stable) and palpitations (stable). Negative for chest pain and leg swelling.  Gastrointestinal:  Negative for melena.   Objective  Blood pressure 129/80, pulse 81, temperature 98 F (36.7 C), temperature source Temporal, resp. rate 17, height 5\' 4"  (1.626 m), weight 150 lb 9.6 oz (68.3 kg), SpO2 99 %. Body mass index is 25.85 kg/m.     10/19/2021   11:21 AM 10/13/2021   11:01 AM 09/02/2021   11:14 AM  Vitals with BMI  Height 5\' 4"  5\' 4"  5\' 4"   Weight 150 lbs 10 oz 147 lbs 150 lbs 10 oz  BMI 25.84 25.22 25.84  Systolic 129 124 09/04/2021  Diastolic 80 68 72  Pulse 81 60 44    Physical Exam Vitals reviewed.  HENT:     Mouth/Throat:     Comments: No tongue swelling Neck:     Vascular: No carotid bruit or JVD.  Cardiovascular:     Rate and Rhythm: Normal rate and regular rhythm.  Frequent Extrasystoles are present.    Pulses: Intact distal pulses.     Heart sounds: Normal heart sounds. No murmur heard.    No gallop.  Pulmonary:     Effort: Pulmonary effort is normal.     Breath sounds: Normal breath sounds.  Abdominal:     General: Bowel sounds are normal.     Palpations: Abdomen is soft.  Musculoskeletal:     Right lower leg: No edema.     Left lower leg: No edema.   Physical exam unchanged compared to previous.    Laboratory examination:   External labs:   05/21/2020:  ANA speckled pattern positive, negative for lupus or systemic sclerosis, polymyositis or dermatomyositis.  proBNP is  normal, mild renal insufficiency, labs are stable for cardiac catheterization.  Cholesterol, total 215.000 m 05/21/2020 HDL 91.000 mg 05/21/2020 LDL 109.000 m 05/21/2020 Triglycerides 82.000 mg 05/21/2020  Hemoglobin 12.900 g/d 03/17/2021 Hematocrit 39.1 Platelets 174  BUN 20 Creatinine, Serum 1.040 mg/ 03/17/2021 eGFR >60 Potassium 4.500 mg/ 03/17/2021 ALT (SGPT) 12.000 U/L 05/21/2020  TSH 1.680 03/17/2021  BNP 227.500 P 03/17/2021  Allergies   Allergies  Allergen Reactions   Verapamil Other (See Comments)    Edema   Bacitracin-Polymyxin B     Other reaction(s): rash   Bystolic [Nebivolol Hcl] Diarrhea, Nausea Only and Swelling   Codeine Nausea Only   Hydralazine Hcl     Other reaction(s): feet and ankles swelling   Metoprolol     Tongue swelling, diarrhea, GI upset    Olmesartan     Other reaction(s): diarrhea, face swelling   Olmesartan Medoxomil-Hctz     Dizziness, facial swelling    Other Nausea And Vomiting    All seafood   Plendil [Felodipine]     Increase BP, leg selling   Iodine Rash    fever   Latex Rash    redness   Neosporin [Neomycin-Bacitracin Zn-Polymyx] Rash    redness   Tape Rash    Medication list after today's encounter   Current Outpatient Medications  Medication Instructions   diltiazem (CARDIZEM CD) 120 mg, Oral, Daily   hydrochlorothiazide (MICROZIDE) 12.5 mg, Oral, Daily   lisinopril (ZESTRIL) 20 mg, Oral, Daily   Vitamin D 2,000 Units, Oral, Daily   Radiology:   CT angiogram chest PE protocol 06/16/2021: 1. Cardiovascular: Pulmonary arterial opacification is adequate without evidence of emboli. There is mild thoracic aortic atherosclerosis without aneurysm. Mild LAD coronary atherosclerosis is noted. The heart is normal in size. There is no pericardial effusion. 2. No pleural effusion or pneumothorax. Mild biapical pleuroparenchymal lung scarring. Multiple pulmonary nodules. Most severe: 6 mm left lower lobe solid pulmonary nodule.  Recommend a non-contrast Chest CT at 3-6 months.  3. Aortic Atherosclerosis.   Please let her know that it is better for a pulmonary doctor to see her for her shortness of breath as the CT is mildly abnormal with nodules in lung. If she is willing I will place orders.  Cardiac Studies:   Bilateral lower extremity venous duplex 04/29/2011: No evidence of bilateral lower extremity DVT.  Right and left heart catheterization 05/26/2021: RA 7/7, mean 4 mmHg RV 28/1, EDP 6 mmHg PA 29/10, mean 18 mmHg PW 8/9, mean 8 mmHg. RA saturation 80%, PA saturation 73%, aortic saturation 99%. QP/QS 0.73.  CO 6.54, CI 3.76, Normal.  LV: 124/3, EDP 13 mmHg.  Ao 118/56, mean 81 mmHg.  No pressure gradient across the aortic valve. LV: Hyperdynamic LV, EF 80%. LM: Large  vessel, normal. LAD: Large vessel, gives origin to large D1.  Smooth and normal. LCx: Very small, nondominant.  Smooth and normal. RCA: Very large.  Dominant.  Normal.  Impression: Dyspnea on exertion probably related to hyperdynamic LV and diastolic dysfunction.  Pulmonary pressures are within normal limits.  Normal coronary arteries, right dominant circulation.  30 mL contrast utilized.  PCV ECHOCARDIOGRAM COMPLETE 08/26/2021  EKG: NSR w/ frequent PVCs. Normal LV systolic function with visual EF 55-60%. Left ventricle cavity is normal in size. Normal left ventricular wall thickness. Normal global wall motion. Doppler evidence of grade II (pseudonormal) diastolic dysfunction, elevated LAP. Left atrial cavity is mildly dilated. Right atrial cavity is mildly dilated. Mild (Grade I) aortic regurgitation. Mild (Grade I) mitral regurgitation. Moderate to severe tricuspid regurgitation. Moderate pulmonary hypertension. RVSP measures 55 mmHg. Compared to 04/01/2021 Grade 2 DD, elevated LAP, LAE, mild AR are new findings, Moderate TR is now moderate/severe, moderate PHTN may be under appreciated due to dilated RA size. Otherwise no significant  change. Consider the frequent PVCs on current study as possible source of dyspnea as well.  Cardiac MRI 10/01/2021:  1.  Normal LV size, wall thickness, and systolic function (EF 17%)  2.  Normal RV size and systolic function (EF 40%)  3.  No late gadolinium enhancement to suggest myocardial scar  4.  Moderate tricuspid regurgitation (regurgitant fraction 32%)  5.  Mild mitral regurgitation (regurgitant fraction 13%)  6.  Small pericardial effusion     EKG:  10/19/2021: Sinus rhythm at rate of 98 bpm, left axis, incomplete right bundle branch block.  Frequent PVCs in bigeminal pattern.  Nonspecific ST-T abnormality.  EKG 05/07/2021: Normal sinus rhythm at rate of 69 bpm, right atrial enlargement, normal axis.  Incomplete right bundle branch block.  Poor R wave progression, cannot exclude anteroseptal infarct old.  Normal QT interval.    Assessment     ICD-10-CM   1. Palpitations  R00.2 EKG 12-Lead    2. Moderate tricuspid regurgitation  I07.1     3. 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   Meds ordered this encounter  Medications   diltiazem (CARDIZEM CD) 120 MG 24 hr capsule    Sig: Take 1 capsule (120 mg total) by mouth daily.    Dispense:  7 capsule    Refill:  4   Orders Placed This Encounter  Procedures   EKG 12-Lead   Recommendations:   DOREAN HIEBERT is a 81 y.o. Caucasian female patient who is very active and lives independently and was doing well until October 2022, suddenly started having marked dyspnea on exertion and marked fatigue even with minimal activity and also leg edema.  She was treated with diuretics, lower extremity duplex was negative for DVT, echocardiogram obtained revealed moderate TR and moderate pulmonary hypertension with RV strain.   Right and left heart catheterization January 2023 revealed normal coronary arteries, hyperdynamic LV, moderately elevated EDP and no evidence of pulmonary hypertension.  In  view of persistent symptoms CT angiogram of the chest performed on 06/16/2021 revealed pulmonary nodules, negative for PE. Patient now follows with pulmonology who feels patient trembling voice over the last several months may be related to lisinopril and have advised changing patient's medications.   Reviewed and discussed results of cardiac MRI, details above. Patient is not taking previously prescribed sildenafil although reason is unclear. Blood pressure is well controlled and palpitations and dyspnea are stable. Will  try diltiazem to suppress PVCs. Could consider switching lisinopril at next office visit but will hold off at this time.   Follow up in 1 month, sooner if needed.    Alethia Berthold, PA-C 10/19/2021, 1:20 PM Office: [phone] CC: Carney Corners, DO (Pulmonary)

## 2021-10-21 ENCOUNTER — Telehealth: Payer: Self-pay

## 2021-10-21 NOTE — Telephone Encounter (Signed)
Patient may stop diltiazem. I will let JG know she was unable to tolerate this.

## 2021-10-21 NOTE — Telephone Encounter (Signed)
Called and spoke to patient.

## 2021-10-28 ENCOUNTER — Other Ambulatory Visit: Payer: Self-pay | Admitting: Student

## 2021-11-16 ENCOUNTER — Ambulatory Visit: Payer: Medicare Other | Admitting: Cardiology

## 2021-11-16 ENCOUNTER — Encounter: Payer: Self-pay | Admitting: Cardiology

## 2021-11-16 VITALS — BP 120/61 | HR 73 | Temp 97.7°F | Resp 16 | Ht 64.0 in | Wt 149.8 lb

## 2021-11-16 DIAGNOSIS — I071 Rheumatic tricuspid insufficiency: Secondary | ICD-10-CM

## 2021-11-16 DIAGNOSIS — I10 Essential (primary) hypertension: Secondary | ICD-10-CM

## 2021-11-16 DIAGNOSIS — R002 Palpitations: Secondary | ICD-10-CM

## 2021-11-16 DIAGNOSIS — I2729 Other secondary pulmonary hypertension: Secondary | ICD-10-CM

## 2021-11-16 MED ORDER — SILDENAFIL CITRATE 20 MG PO TABS: 20.0000 mg | ORAL_TABLET | Freq: Three times a day (TID) | ORAL | 2 refills | Status: DC

## 2021-11-16 NOTE — Progress Notes (Signed)
Primary Physician/Referring:  Georgann Housekeeper, MD  Patient ID: Christine Hall, female    DOB: 1941/03/05, 81 y.o.   MRN: [id number]  Chief Complaint  Patient presents with   Follow-up   PVC   HPI:    Christine Hall  is a 81 y.o.  Caucasian female patient who is very active and lives independently and was doing well until October 2022, suddenly started having marked dyspnea on exertion and marked fatigue even with minimal activity and also leg edema.  She was treated with diuretics, lower extremity duplex was negative for DVT, echocardiogram obtained revealed moderate TR and moderate pulmonary hypertension with RV strain.  However right heart catheterization in January 2023 revealed normal coronary arteries, hyperdynamic LVEF, moderately elevated LVEDP but no pulm hypertension.  But in view of her presentation consistent with group 2 and 3 PAH, I have started her on sildenafil 20 mg 3 times daily.  She now presents for follow-up of dyspnea on exertion, frequent PVCs noted on EKG and hypertension.  She has been unable to tolerate beta blockers, verapamil, diltiazem or clonidine.  No change in her symptoms  Past Medical History:  Diagnosis Date   Basal cell carcinoma 09/11/2008   left inner eye tx mohs   BCC (basal cell carcinoma of skin) 10/18/2013   left cheek tx mohs   Hypertension    Past Surgical History:  Procedure Laterality Date   LAPAROSCOPIC CHOLECYSTECTOMY  2002   macular pucker Left 2012   RIGHT/LEFT HEART CATH AND CORONARY ANGIOGRAPHY N/A 05/26/2021   Procedure: RIGHT/LEFT HEART CATH AND CORONARY ANGIOGRAPHY;  Surgeon: Yates Decamp, MD;  Location: [facility];  Service: Cardiovascular;  Laterality: N/A;   TOTAL ABDOMINAL HYSTERECTOMY  1982   Family History  Problem Relation Age of Onset   Heart attack Mother    Heart attack Father    Colon cancer Father 53   Atrial fibrillation Sister    Heart attack Brother     Social History   Tobacco Use   Smoking  status: Never   Smokeless tobacco: Never  Substance Use Topics   Alcohol use: No   Marital Status: Married  ROS  Review of Systems  Cardiovascular:  Positive for dyspnea on exertion (stable) and palpitations (stable). Negative for chest pain and leg swelling.  Gastrointestinal:  Negative for melena.   Objective  Blood pressure 120/61, pulse 73, temperature 97.7 F (36.5 C), temperature source Temporal, resp. rate 16, height 5\' 4"  (1.626 m), weight 149 lb 12.8 oz (67.9 kg), SpO2 98 %. Body mass index is 25.71 kg/m.     11/16/2021    2:56 PM 11/16/2021    2:22 PM 10/19/2021   11:21 AM  Vitals with BMI  Height  5\' 4"  5\' 4"   Weight  149 lbs 13 oz 150 lbs 10 oz  BMI  25.7 25.84  Systolic 120 149 12/19/2021  Diastolic 61 65 80  Pulse  73 81    Physical Exam Neck:     Vascular: No JVD.  Cardiovascular:     Rate and Rhythm: Normal rate and regular rhythm.     Pulses: Intact distal pulses.     Heart sounds: Normal heart sounds. No murmur heard.    No gallop.  Pulmonary:     Effort: Pulmonary effort is normal.     Breath sounds: Normal breath sounds.  Abdominal:     General: Bowel sounds are normal.     Palpations: Abdomen is soft.  Musculoskeletal:  Right lower leg: No edema.     Left lower leg: No edema.   Physical exam unchanged compared to previous.    Laboratory examination:   External labs:   05/21/2020:  ANA speckled pattern positive, negative for lupus or systemic sclerosis, polymyositis or dermatomyositis.  proBNP is normal, mild renal insufficiency, labs are stable for cardiac catheterization.  Cholesterol, total 215.000 m 05/21/2020 HDL 91.000 mg 05/21/2020 LDL 109.000 m 05/21/2020 Triglycerides 82.000 mg 05/21/2020  Hemoglobin 12.900 g/d 03/17/2021 Hematocrit 39.1 Platelets 174  BUN 20 Creatinine, Serum 1.040 mg/ 03/17/2021 eGFR >60 Potassium 4.500 mg/ 03/17/2021 ALT (SGPT) 12.000 U/L 05/21/2020  TSH 1.680 03/17/2021  BNP 227.500 P 03/17/2021  Allergies    Allergies  Allergen Reactions   Verapamil Other (See Comments)    Edema   Bacitracin-Polymyxin B     Other reaction(s): rash   Bystolic [Nebivolol Hcl] Diarrhea, Nausea Only and Swelling   Codeine Nausea Only   Diltiazem     Bad wheezing, ankle swelling, heaviness across chest, left foot tingling    Hydralazine Hcl     Other reaction(s): feet and ankles swelling   Metoprolol     Tongue swelling, diarrhea, GI upset    Olmesartan     Other reaction(s): diarrhea, face swelling   Olmesartan Medoxomil-Hctz     Dizziness, facial swelling    Other Nausea And Vomiting    All seafood   Plendil [Felodipine]     Increase BP, leg selling   Iodine Rash    fever   Latex Rash    redness   Neosporin [Neomycin-Bacitracin Zn-Polymyx] Rash    redness   Tape Rash    Medication list after today's encounter    Current Outpatient Medications:    Cholecalciferol (VITAMIN D) 50 MCG (2000 UT) CAPS, Take 2,000 Units by mouth daily., Disp: , Rfl:    hydrochlorothiazide (MICROZIDE) 12.5 MG capsule, Take 1 capsule by mouth once daily, Disp: 30 capsule, Rfl: 0   lisinopril (ZESTRIL) 20 MG tablet, Take 20 mg by mouth daily., Disp: , Rfl:    pantoprazole (PROTONIX) 20 MG tablet, Take 20 mg by mouth daily., Disp: , Rfl:    sildenafil (REVATIO) 20 MG tablet, Take 1 tablet (20 mg total) by mouth 3 (three) times daily., Disp: 90 tablet, Rfl: 2  Radiology:   CT angiogram chest PE protocol 06/16/2021: 1. Cardiovascular: Pulmonary arterial opacification is adequate without evidence of emboli. There is mild thoracic aortic atherosclerosis without aneurysm. Mild LAD coronary atherosclerosis is noted. The heart is normal in size. There is no pericardial effusion. 2. No pleural effusion or pneumothorax. Mild biapical pleuroparenchymal lung scarring. Multiple pulmonary nodules. Most severe: 6 mm left lower lobe solid pulmonary nodule. Recommend a non-contrast Chest CT at 3-6 months.  3. Aortic  Atherosclerosis.  Cardiac Studies:   Bilateral lower extremity venous duplex 04/29/2011: No evidence of bilateral lower extremity DVT.  Right and left heart catheterization 05/26/2021: RA 7/7, mean 4 mmHg RV 28/1, EDP 6 mmHg PA 29/10, mean 18 mmHg PW 8/9, mean 8 mmHg. RA saturation 80%, PA saturation 73%, aortic saturation 99%. QP/QS 0.73.  CO 6.54, CI 3.76, Normal.  LV: 124/3, EDP 13 mmHg.  Ao 118/56, mean 81 mmHg.  No pressure gradient across the aortic valve. LV: Hyperdynamic LV, EF 80%. LM: Large vessel, normal. LAD: Large vessel, gives origin to large D1.  Smooth and normal. LCx: Very small, nondominant.  Smooth and normal. RCA: Very large.  Dominant.  Normal.  Impression: Dyspnea on  exertion probably related to hyperdynamic LV and diastolic dysfunction.  Pulmonary pressures are within normal limits.  Normal coronary arteries, right dominant circulation.  30 mL contrast utilized.  PCV ECHOCARDIOGRAM COMPLETE 08/26/2021  EKG: NSR w/ frequent PVCs. Normal LV systolic function with visual EF 55-60%. Left ventricle cavity is normal in size. Normal left ventricular wall thickness. Normal global wall motion. Doppler evidence of grade II (pseudonormal) diastolic dysfunction, elevated LAP. Left atrial cavity is mildly dilated. Right atrial cavity is mildly dilated. Mild (Grade I) aortic regurgitation. Mild (Grade I) mitral regurgitation. Moderate to severe tricuspid regurgitation. Moderate pulmonary hypertension. RVSP measures 55 mmHg. Compared to 04/01/2021 Grade 2 DD, elevated LAP, LAE, mild AR are new findings, Moderate TR is now moderate/severe, moderate PHTN may be under appreciated due to dilated RA size. Otherwise no significant change. Consider the frequent PVCs on current study as possible source of dyspnea as well.  Cardiac MRI 10/01/2021:  1.  Normal LV size, wall thickness, and systolic function (EF 62%)  2.  Normal RV size and systolic function (EF 83%)  3.  No late  gadolinium enhancement to suggest myocardial scar  4.  Moderate tricuspid regurgitation (regurgitant fraction 32%)  5.  Mild mitral regurgitation (regurgitant fraction 13%)  6.  Small pericardial effusion.    EKG:  10/19/2021: Sinus rhythm at rate of 98 bpm, left axis, incomplete right bundle branch block.  Frequent PVCs in bigeminal pattern.  Nonspecific ST-T abnormality.  EKG 05/07/2021: Normal sinus rhythm at rate of 69 bpm, right atrial enlargement, normal axis.  Incomplete right bundle branch block.  Poor R wave progression, cannot exclude anteroseptal infarct old.  Normal QT interval.    Assessment     ICD-10-CM   1. Palpitations  R00.2     2. Moderate tricuspid regurgitation  I07.1     3. Primary hypertension  I10     4. Other secondary pulmonary hypertension (HCC)  I27.29 sildenafil (REVATIO) 20 MG tablet       There are no discontinued medications.   Meds ordered this encounter  Medications   sildenafil (REVATIO) 20 MG tablet    Sig: Take 1 tablet (20 mg total) by mouth 3 (three) times daily.    Dispense:  90 tablet    Refill:  2   No orders of the defined types were placed in this encounter.  Recommendations:   Christine Hall is a 81 y.o. Caucasian female patient who is very active and lives independently and was doing well until October 2022, suddenly started having marked dyspnea on exertion and marked fatigue even with minimal activity and also leg edema.  She was treated with diuretics, lower extremity duplex was negative for DVT, echocardiogram obtained revealed moderate TR and moderate pulmonary hypertension with RV strain.  However right heart catheterization in January 2023 revealed normal coronary arteries, hyperdynamic LVEF, moderately elevated LVEDP but no pulm hypertension.  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  Fortunately the cardiac MR did not reveal any significant structural abnormality and she had normal RV size and RV function.  Hence if her palpitation symptoms continues to be bothersome, I could certainly try flecainide, patient has multiple medication allergies, hence I would like to try 1 medication at a time.  I have reassured the patient that this condition that she has  are life altering and not life-threatening, she has been avoiding all kinds of travel, advised her that she could increase her physical activity and also travel without any contraindication from cardiac standpoint.  Office visit in 6 weeks with repeat EKG.  6-minute walk test evaluated, she has excellent exercise tolerance, oxygen saturation remained at 100%.    Adrian Prows, PA-C 11/16/2021, 3:06 PM Office: [phone] CC: Carney Corners, DO (Pulmonary)

## 2021-11-25 ENCOUNTER — Other Ambulatory Visit: Payer: Self-pay | Admitting: Student

## 2021-12-03 ENCOUNTER — Ambulatory Visit: Payer: Medicare Other | Admitting: Pulmonary Disease

## 2021-12-03 ENCOUNTER — Telehealth: Payer: Self-pay | Admitting: Cardiology

## 2021-12-03 NOTE — Telephone Encounter (Signed)
Patient would like someone to call her. She had asked to leave a VM for JG's medical assistant, but since [place] isn't here, I'm leaving this message for whoever can call her back.

## 2021-12-14 ENCOUNTER — Telehealth: Payer: Self-pay | Admitting: Adult Health

## 2021-12-14 NOTE — Telephone Encounter (Signed)
Called and spoke to patient.  She stated that she has been waking up gasping for air during sleep and wheezing in the morning x6d.  SOB with exertion is baseline.  Denied f/c/s or additional sx.  She does not have any inhalers.    Tammy Parrett, please advise. Thanks

## 2021-12-14 NOTE — Telephone Encounter (Signed)
Scheduled pt for OV with Katie Cobb on 12/23/21. Nothing further needed at this time.   Routing to [employer] as [employer]

## 2021-12-14 NOTE — Telephone Encounter (Signed)
Will need office visit for further evaluation   Please contact office for sooner follow up if symptoms do not improve or worsen or seek emergency care

## 2021-12-23 ENCOUNTER — Encounter: Payer: Self-pay | Admitting: Nurse Practitioner

## 2021-12-23 ENCOUNTER — Ambulatory Visit (INDEPENDENT_AMBULATORY_CARE_PROVIDER_SITE_OTHER): Payer: Medicare Other | Admitting: Nurse Practitioner

## 2021-12-23 ENCOUNTER — Ambulatory Visit (INDEPENDENT_AMBULATORY_CARE_PROVIDER_SITE_OTHER): Payer: Medicare Other

## 2021-12-23 VITALS — BP 126/64 | HR 68 | Temp 97.8°F | Ht 64.25 in | Wt 148.8 lb

## 2021-12-23 DIAGNOSIS — J45909 Unspecified asthma, uncomplicated: Secondary | ICD-10-CM | POA: Insufficient documentation

## 2021-12-23 DIAGNOSIS — J383 Other diseases of vocal cords: Secondary | ICD-10-CM

## 2021-12-23 DIAGNOSIS — J4530 Mild persistent asthma, uncomplicated: Secondary | ICD-10-CM | POA: Diagnosis not present

## 2021-12-23 DIAGNOSIS — I5032 Chronic diastolic (congestive) heart failure: Secondary | ICD-10-CM

## 2021-12-23 DIAGNOSIS — I5030 Unspecified diastolic (congestive) heart failure: Secondary | ICD-10-CM | POA: Insufficient documentation

## 2021-12-23 DIAGNOSIS — R059 Cough, unspecified: Secondary | ICD-10-CM

## 2021-12-23 DIAGNOSIS — R053 Chronic cough: Secondary | ICD-10-CM

## 2021-12-23 LAB — NITRIC OXIDE: Nitric Oxide: 16

## 2021-12-23 MED ORDER — FLUTICASONE-SALMETEROL 45-21 MCG/ACT IN AERO: 2.0000 | INHALATION_SPRAY | Freq: Two times a day (BID) | RESPIRATORY_TRACT | 5 refills | Status: DC

## 2021-12-23 MED ORDER — FLUTICASONE PROPIONATE 50 MCG/ACT NA SUSP: 2.0000 | Freq: Every day | NASAL | 2 refills | Status: DC

## 2021-12-23 NOTE — Assessment & Plan Note (Signed)
Recent worsening in chronic cough. Target cough control measures and postnasal drainage. Will switch from oral antihistamine to intranasal steroid d/t difficulties tolerating Claritin. See above plan.

## 2021-12-23 NOTE — Patient Instructions (Addendum)
Flonase nasal spray 2 sprays each nostril daily  Chlorpheniramine 4 mg over the counter at bedtime for cough/allergies  Delsym 2 tsp Twice daily for cough  Trial Advair 2 puffs Twice daily. Brush tongue and rinse mouth afterwards.   Chest x ray today.   Follow up with Dr. Valeta Harms after Pulmonary Function Testing on 9/13. If symptoms do not improve or worsen, please contact office for sooner follow up or seek emergency care.

## 2021-12-23 NOTE — Assessment & Plan Note (Signed)
She has intermittent wheezing, PND, reactive dry cough with environmental triggers and recent viral infection. Worsening in cough possibly postviral or upper airway irritation. Could also have VCD with her changes in voice recently - awaiting ENT appt. FeNO today was nl; however, symptoms have also improved. With her constellation of symptoms and triggers, concerned there still could be an asthmatic component. No bronchospasm on exam today so shared decision to hold off on steroids. We will check CXR to rule out superimposed infection. Recommended trial of ICS/LABA therapy to see if she receives benefit. Encouraged to keep appt for PFTs for further evaluation.   Patient Instructions  Flonase nasal spray 2 sprays each nostril daily  Chlorpheniramine 4 mg over the counter at bedtime for cough/allergies  Delsym 2 tsp Twice daily for cough  Trial Advair 2 puffs Twice daily. Brush tongue and rinse mouth afterwards.   Chest x ray today.   Follow up with Dr. Valeta Harms after Pulmonary Function Testing on 9/13. If symptoms do not improve or worsen, please contact office for sooner follow up or seek emergency care.

## 2021-12-23 NOTE — Progress Notes (Signed)
Please notify patient CXR was clear. thanks

## 2021-12-23 NOTE — Assessment & Plan Note (Signed)
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 Appears euvolemic on exam today. She does take her hctz as prescribed but does not take her sildenafil. We will obtain CXR to assess for volume overload.

## 2021-12-23 NOTE — Assessment & Plan Note (Signed)
Awaiting evaluation with ENT.Referral was placed at previous OV. Advised her to contact [facility] ENT to see if she can set up appt

## 2021-12-23 NOTE — Progress Notes (Signed)
$'@Patient'r$  ID: Christine Hall, female    DOB: 21-Aug-1940, 81 y.o.   MRN: [id number]  Chief Complaint  Patient presents with   Follow-up    Sob, dry cough, waking up during the night "gasping", wheezing    Referring provider: Wenda Low, MD  HPI: 81 year old female, never smoker followed for pulmonary nodules, shortness of breath and cough.  She is a patient of Dr. Juline Patch and last seen in office 10/13/2021 by Royal Piedra NP.  Past medical history significant for spastic dysphonia, hypertension, basal cell carcinoma of the skin.  TEST/EVENTS:  06/16/2021 CTA chest: No evidence of PE.  There is atherosclerosis present.  There is a subcentimeter calcified thyroid nodule; no need for follow-up.  There is mild pleural parenchymal lung scarring.  There is a 3 mm nodule in the right lower lobe.  7 x 5 mm subpleural nodule in the left lower lobe.  Punctate calcified nodule in the superior segment of the left lower lobe. 10/08/2021 super D CT chest: Atherosclerosis.  Biapical pleural-parenchymal scarring.  There is a left lower lobe subpleural nodule measures 8 x 5 x 7 mm, unchanged.  There is a 11 x 4 x 3 left lower lobe pulmonary nodule, unchanged with remeasure.  10/13/2021: OV with Parrett NP for follow-up.  Last seen for pulmonary consult in March 2023.  She started having increased shortness of breath around October 2022.  Noticed that she was getting short of breath with minimal activities and decreased activity tolerance.  She was evaluated by cardiology which showed moderate tricuspid regurgitation and moderate pulmonary hypertension with RV strain.  Venous Dopplers were negative for DVT.  She was treated for fluid overload with decompensated diastolic heart failure with diuretics.  She underwent a right and left heart cath completed on May 26, 2021.  This showed normal pulmonary pressures.  Felt dyspnea was related to diastolic dysfunction.  She was recommended to change lisinopril but was unable to  tolerate multiple blood pressure medicines so restarted this.  During this OV, she reported that her shortness of breath was improved and activity tolerance also improved.  Did not feel like she was back to baseline but felt better than she was 6 months ago.  Continues to have ongoing minimally productive dry cough, intermittent wheezing and whistling in her throat.  She also has some postnasal drainage.  She has noticed over the last year that her voice has become very weak and shaky at times.  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  Denied any difficulty swallowing.  Referred to ENT for evaluation of upper airway/vocal cords.  Recommended discontinuation of ACE inhibitor.  Add medications for trigger prevention with Protonix and Claritin.  PFTs ordered for further evaluation.  Recent CT chest with stable pulmonary nodules -plan for repeat in 12 months.  12/23/2021: Today - acute Patient presents today for acute visit. She was sick towards the end of July with flu like symptoms. Had fevers, cough, chest congestion, sinus symptoms. Fevers lasted 2 days. She did not COVID test and was not treated with any abx or steroids. Her granddaughter was also sick at the time. Over the past few weeks, she has had trouble with increased shortness of breath with exertion especially with household chores such as vacuuming, doing laundry, and dusting and a persistent dry cough. She has also had some episodes where she wakes up gasping and feels like she cannot catch her  breath. She also notices wheezing during these episodes and sometimes in the mornings. She does feel like she has been better over the past few days and hasn't had any more of the nocturnal dyspnea symptoms. She denies any recurrent fevers, night sweats, chills, chest congestion, hemoptysis, leg swelling, orthopnea. She has never been on any inhalers in the past. She is still taking her lisinopril; has  not tried herself off. She is not taking claritin regularly, feels like it made her sick. She also continues to have trouble with fluctuating symptoms in her voice; has not seen ENT yet. No difficulties swallowing.   Allergies  Allergen Reactions   Verapamil Other (See Comments)    Edema   Bacitracin-Polymyxin B     Other reaction(s): rash   Bystolic [Nebivolol Hcl] Diarrhea, Nausea Only and Swelling   Codeine Nausea Only   Diltiazem     Bad wheezing, ankle swelling, heaviness across chest, left foot tingling    Hydralazine Hcl     Other reaction(s): feet and ankles swelling   Metoprolol     Tongue swelling, diarrhea, GI upset    Olmesartan     Other reaction(s): diarrhea, face swelling   Olmesartan Medoxomil-Hctz     Dizziness, facial swelling    Other Nausea And Vomiting    All seafood   Plendil [Felodipine]     Increase BP, leg selling   Sildenafil Citrate     Dizzy, nausea   Iodine Rash    fever   Latex Rash    redness   Neosporin [Neomycin-Bacitracin Zn-Polymyx] Rash    redness   Tape Rash    Immunization History  Administered Date(s) Administered   Fluad Quad(high Dose 65+) 01/28/2021   Influenza Split 03/05/2010, 03/22/2011, 03/08/2012, 02/15/2013, 02/13/2014, 02/07/2015, 01/28/2021   Influenza, High Dose Seasonal PF 02/18/2013, 02/13/2014, 02/05/2015, 03/03/2016, 02/23/2017, 02/22/2018, 01/25/2019, 02/21/2020   Moderna Covid-19 Vaccine Bivalent Booster 57yr & up 02/17/2021   Moderna SARS-COV2 Booster Vaccination 03/11/2020   Moderna Sars-Covid-2 Vaccination 05/21/2019, 06/18/2019, 03/11/2020, 02/17/2021   Pneumococcal Conjugate-13 02/13/2014   Pneumococcal Polysaccharide-23 03/26/2009   Td 04/12/2006   Tdap 04/06/2016   Zoster, Live 04/04/2012    Past Medical History:  Diagnosis Date   Basal cell carcinoma 09/11/2008   left inner eye tx [facility]   BCC (basal cell carcinoma of skin) 10/18/2013   left cheek tx [facility]s   Hypertension     Tobacco  History: Social History   Tobacco Use  Smoking Status Never  Smokeless Tobacco Never   Counseling given: Not Answered   Outpatient Medications Prior to Visit  Medication Sig Dispense Refill   Cholecalciferol (VITAMIN D) 50 MCG (2000 UT) CAPS Take 2,000 Units by mouth daily.     hydrochlorothiazide (MICROZIDE) 12.5 MG capsule Take 1 capsule by mouth once daily 30 capsule 0   lisinopril (ZESTRIL) 20 MG tablet Take 20 mg by mouth daily.     pantoprazole (PROTONIX) 20 MG tablet Take 20 mg by mouth daily.     sildenafil (REVATIO) 20 MG tablet Take 1 tablet (20 mg total) by mouth 3 (three) times daily. (Patient not taking: Reported on 12/23/2021) 90 tablet 2   No facility-administered medications prior to visit.     Review of Systems:   Constitutional: No weight loss or gain, night sweats, fevers, chills, or lassitude. +fatigue HEENT: No headaches, difficulty swallowing, tooth/dental problems, or sore throat. No sneezing, itching, ear ache, nasal congestion. +post nasal drip, changes in voice quality CV:  +PND (improved).  No chest pain, orthopnea, swelling in lower extremities, anasarca, dizziness, palpitations, syncope Resp: +shortness of breath with exertion; dry cough; wheezing. No excess mucus or change in color of mucus. No hemoptysis.  No chest wall deformity GI:  No heartburn, indigestion, abdominal pain, nausea, vomiting, diarrhea, change in bowel habits, loss of appetite, bloody stools.  GU: No dysuria, change in color of urine, urgency or frequency.  No flank pain, no hematuria  Skin: No rash, lesions, ulcerations MSK:  No joint pain or swelling.  No decreased range of motion.  No back pain. Neuro: No dizziness or lightheadedness.  Psych: No depression or anxiety. Mood stable.     Physical Exam:  BP 126/64 (BP Location: Right Arm, Cuff Size: Normal)   Pulse 68   Temp 97.8 F (36.6 C) (Temporal)   Ht 5' 4.25" (1.632 m)   Wt 148 lb 12.8 oz (67.5 kg)   SpO2 100%   BMI  25.34 kg/m   GEN: Pleasant, interactive, well-appearing; in no acute distress HEENT:  Normocephalic and atraumatic. EACs patent bilaterally. TM pearly gray with present light reflex bilaterally. PERRLA. Sclera white. Nasal turbinates erythematous, moist and patent bilaterally. Clear rhinorrhea present. Oropharynx pink and moist, without exudate or edema. No lesions, ulcerations. Spastic voice quality NECK:  Supple w/ fair ROM. No JVD present. Normal carotid impulses w/o bruits. Thyroid symmetrical with no goiter or nodules palpated. No lymphadenopathy.   CV: RRR, no m/r/g, no peripheral edema. Pulses intact, +2 bilaterally. No cyanosis, pallor or clubbing. PULMONARY:  Unlabored, regular breathing. Clear bilaterally A&P w/o wheezes/rales/rhonchi. No accessory muscle use. No dullness to percussion. GI: BS present and normoactive. Soft, non-tender to palpation. No organomegaly or masses detected. No CVA tenderness. MSK: No erythema, warmth or tenderness. Cap refil <2 sec all extrem. No deformities or joint swelling noted.  Neuro: A/Ox3. No focal deficits noted.   Skin: Warm, no lesions or rashe Psych: Normal affect and behavior. Judgement and thought content appropriate.     Lab Results:  CBC    Component Value Date/Time   WBC 6.8 05/26/2021 1038   RBC 4.76 05/26/2021 1038   HGB 12.6 05/26/2021 1306   HCT 37.0 05/26/2021 1306   PLT 183 05/26/2021 1038   MCV 92.2 05/26/2021 1038   MCH 28.8 05/26/2021 1038   MCHC 31.2 05/26/2021 1038   RDW 12.9 05/26/2021 1038    BMET    Component Value Date/Time   NA 141 07/03/2021 1113   K 4.7 07/03/2021 1113   CL 103 07/03/2021 1113   CO2 23 07/03/2021 1113   GLUCOSE 84 07/03/2021 1113   BUN 25 07/03/2021 1113   CREATININE 1.18 (H) 07/03/2021 1113   CALCIUM 9.0 07/03/2021 1113    BNP No results found for: "BNP"   Imaging:  DG Chest 2 View  Result Date: 12/23/2021 CLINICAL DATA:  Cough, shortness of breath EXAM: CHEST - 2 VIEW  COMPARISON:  02/01/2011 FINDINGS: The heart size and mediastinal contours are within normal limits. Both lungs are clear. The visualized skeletal structures are unremarkable. IMPRESSION: No acute abnormality of the lungs. Electronically Signed   By: Delanna Ahmadi M.D.   On: 12/23/2021 12:31          No data to display          Lab Results  Component Value Date   NITRICOXIDE 16 12/23/2021        Assessment & Plan:   Reactive airway disease She has intermittent wheezing, PND, reactive dry cough with  environmental triggers and recent viral infection. Worsening in cough possibly postviral or upper airway irritation. Could also have VCD with her changes in voice recently - awaiting ENT appt. FeNO today was nl; however, symptoms have also improved. With her constellation of symptoms and triggers, concerned there still could be an asthmatic component. No bronchospasm on exam today so shared decision to hold off on steroids. We will check CXR to rule out superimposed infection. Recommended trial of ICS/LABA therapy to see if she receives benefit. Encouraged to keep appt for PFTs for further evaluation.   Patient Instructions  Flonase nasal spray 2 sprays each nostril daily  Chlorpheniramine 4 mg over the counter at bedtime for cough/allergies  Delsym 2 tsp Twice daily for cough  Trial Advair 2 puffs Twice daily. Brush tongue and rinse mouth afterwards.   Chest x ray today.   Follow up with Dr. Valeta Harms after Pulmonary Function Testing on 9/13. If symptoms do not improve or worsen, please contact office for sooner follow up or seek emergency care.     Chronic cough Recent worsening in chronic cough. Target cough control measures and postnasal drainage. Will switch from oral antihistamine to intranasal steroid d/t difficulties tolerating Claritin. See above plan.   Spastic dysphonia Awaiting evaluation with ENT.Referral was placed at previous OV. 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 Appears euvolemic on exam today. She does take her hctz as prescribed but does not take her sildenafil. We will obtain CXR to assess for volume overload.   I spent 35 minutes of dedicated to the care of this patient on the date of this encounter to include pre-visit review of records, face-to-face time with the patient discussing conditions above, post visit ordering of testing, clinical documentation with the electronic health record, making appropriate referrals as documented, and communicating necessary findings to members of the patients care team.  Clayton Bibles, NP 12/23/2021  Pt aware and understands NP's role.

## 2021-12-27 ENCOUNTER — Other Ambulatory Visit: Payer: Self-pay | Admitting: Cardiology

## 2021-12-28 ENCOUNTER — Encounter: Payer: Self-pay | Admitting: Cardiology

## 2021-12-28 ENCOUNTER — Ambulatory Visit: Payer: Medicare Other | Admitting: Cardiology

## 2021-12-28 ENCOUNTER — Inpatient Hospital Stay: Payer: Medicare Other

## 2021-12-28 ENCOUNTER — Telehealth: Payer: Self-pay | Admitting: Nurse Practitioner

## 2021-12-28 VITALS — BP 120/64 | HR 50 | Temp 97.6°F | Resp 16 | Ht 64.0 in | Wt 148.0 lb

## 2021-12-28 DIAGNOSIS — I2729 Other secondary pulmonary hypertension: Secondary | ICD-10-CM

## 2021-12-28 DIAGNOSIS — R002 Palpitations: Secondary | ICD-10-CM

## 2021-12-28 DIAGNOSIS — I493 Ventricular premature depolarization: Secondary | ICD-10-CM

## 2021-12-28 DIAGNOSIS — I5032 Chronic diastolic (congestive) heart failure: Secondary | ICD-10-CM

## 2021-12-28 DIAGNOSIS — I10 Essential (primary) hypertension: Secondary | ICD-10-CM

## 2021-12-28 MED ORDER — FLECAINIDE ACETATE 50 MG PO TABS: 25.0000 mg | ORAL_TABLET | Freq: Two times a day (BID) | ORAL | 2 refills | Status: DC

## 2021-12-28 MED ORDER — HYDROCHLOROTHIAZIDE 12.5 MG PO CAPS: 12.5000 mg | ORAL_CAPSULE | Freq: Every day | ORAL | 0 refills | Status: DC

## 2021-12-28 MED ORDER — ACEBUTOLOL HCL 200 MG PO CAPS
200.0000 mg | ORAL_CAPSULE | Freq: Two times a day (BID) | ORAL | 2 refills | Status: DC
Start: 2021-12-28 — End: 2022-03-31

## 2021-12-28 NOTE — Progress Notes (Signed)
Primary Physician/Referring:  Wenda Low, MD  Patient ID: Christine Hall, female    DOB: 1940-08-01, 81 y.o.   MRN: [id number]  Chief Complaint  Patient presents with   Palpitations   Hypertension   Follow-up    6 week   HPI:    Christine Hall  is a 81 y.o.  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    She was treated with diuretics, lower extremity duplex was negative for DVT, echocardiogram obtained revealed moderate TR and moderate pulmonary hypertension with RV strain.  However right heart catheterization in January 2023 revealed normal coronary arteries, hyperdynamic LVEF, moderately elevated LVEDP but no pulm hypertension.  Cardiac MR on 10/01/2021 did not reveal any significant structural abnormality and it revealed normal RV size and RV function as well.  She has gradually improved with regard to dyspnea, she has not had any further leg edema, continues to have palpitations but states that they are improved as well.  No PND or orthopnea.  States that she has not been able to reach back or get back to her baseline prior to the onset of all her symptoms in October-November 2022.  No PND or orthopnea, no chest pain.  Past Medical History:  Diagnosis Date   Basal cell carcinoma 09/11/2008   left inner eye tx [facility]s   BCC (basal cell carcinoma of skin) 10/18/2013   left cheek tx mohs   Hypertension    Past Surgical History:  Procedure Laterality Date   LAPAROSCOPIC CHOLECYSTECTOMY  2002   macular pucker Left 2012   RIGHT/LEFT HEART CATH AND CORONARY ANGIOGRAPHY N/A 05/26/2021   Procedure: RIGHT/LEFT HEART CATH AND CORONARY ANGIOGRAPHY;  Surgeon: Adrian Prows, MD;  Location: [facility];  Service:  Cardiovascular;  Laterality: N/A;   TOTAL ABDOMINAL HYSTERECTOMY  1982   Family History  Problem Relation Age of Onset   Heart attack Mother    Heart attack Father    Colon cancer Father 8   Atrial fibrillation Sister    Heart attack Brother     Social History   Tobacco Use   Smoking status: Never   Smokeless tobacco: Never  Substance Use Topics   Alcohol use: No   Marital Status: Married  ROS  Review of Systems  Cardiovascular:  Positive for dyspnea on exertion (stable) and palpitations (stable). Negative for chest pain and leg swelling.  Gastrointestinal:  Negative for melena.   Objective  Blood pressure 120/64, pulse (!) 50, temperature 97.6 F (36.4 C), temperature source Temporal, resp. rate 16, height $RemoveBe'5\' 4"'sBjYKBGQU$  (1.626 m), weight 148 lb (67.1 kg), SpO2 99 %. Body mass index is 25.4 kg/m.     12/28/2021    2:46 PM 12/28/2021    1:38 PM 12/23/2021   11:30 AM  Vitals with BMI  Height  $Remov'5\' 4"'ZdErXQ$  5' 4.25"  Weight  148 lbs 148 lbs 13 oz  BMI  78.29 56.21  Systolic 308 657 846  Diastolic 64 60 64  Pulse  50 68    Physical Exam Neck:     Vascular: No JVD.  Cardiovascular:     Rate and Rhythm: Normal rate and regular rhythm.     Pulses: Intact distal pulses.     Heart sounds: Normal  heart sounds. No murmur heard.    No gallop.  Pulmonary:     Effort: Pulmonary effort is normal.     Breath sounds: Normal breath sounds.  Abdominal:     General: Bowel sounds are normal.     Palpations: Abdomen is soft.  Musculoskeletal:     Right lower leg: No edema.     Left lower leg: No edema.   Physical exam unchanged compared to previous.    Laboratory examination:      Latest Ref Rng & Units 07/03/2021   11:13 AM 05/26/2021    1:06 PM 05/26/2021    1:05 PM  CMP  Glucose 70 - 99 mg/dL 84     BUN 8 - 27 mg/dL 25     Creatinine 0.57 - 1.00 mg/dL 1.18     Sodium 134 - 144 mmol/L 141  143  144   Potassium 3.5 - 5.2 mmol/L 4.7  4.1  3.9   Chloride 96 - 106 mmol/L 103     CO2 20  - 29 mmol/L 23     Calcium 8.7 - 10.3 mg/dL 9.0         Latest Ref Rng & Units 05/26/2021    1:06 PM 05/26/2021    1:05 PM 05/26/2021    1:01 PM  CBC  Hemoglobin 12.0 - 15.0 g/dL 12.6  12.6  12.9   Hematocrit 36.0 - 46.0 % 37.0  37.0  38.0     External labs:   05/21/2020:  ANA speckled pattern positive, negative for lupus or systemic sclerosis, polymyositis or dermatomyositis.  proBNP is normal, mild renal insufficiency, labs are stable for cardiac catheterization.  Cholesterol, total 215.000 m 05/21/2020 HDL 91.000 mg 05/21/2020 LDL 109.000 m 05/21/2020 Triglycerides 82.000 mg 05/21/2020  Hemoglobin 12.900 g/d 03/17/2021 Hematocrit 39.1 Platelets 174  BUN 20 Creatinine, Serum 1.040 mg/ 03/17/2021 eGFR >60 Potassium 4.500 mg/ 03/17/2021 ALT (SGPT) 12.000 U/L 05/21/2020  TSH 1.680 03/17/2021  BNP 227.500 P 03/17/2021  Allergies   Allergies  Allergen Reactions   Verapamil Other (See Comments)    Edema   Bacitracin-Polymyxin B     Other reaction(s): rash   Bystolic [Nebivolol Hcl] Diarrhea, Nausea Only and Swelling   Codeine Nausea Only   Diltiazem     Bad wheezing, ankle swelling, heaviness across chest, left foot tingling    Hydralazine Hcl     Other reaction(s): feet and ankles swelling   Metoprolol     Tongue swelling, diarrhea, GI upset    Olmesartan     Other reaction(s): diarrhea, face swelling   Olmesartan Medoxomil-Hctz     Dizziness, facial swelling    Other Nausea And Vomiting    All seafood   Plendil [Felodipine]     Increase BP, leg selling   Sildenafil Citrate     Dizzy, nausea   Iodine Rash    fever   Latex Rash    redness   Neosporin [Neomycin-Bacitracin Zn-Polymyx] Rash    redness   Tape Rash    Medication list after today's encounter    Current Outpatient Medications:    acebutolol (SECTRAL) 200 MG capsule, Take 1 capsule (200 mg total) by mouth 2 (two) times daily., Disp: 60 capsule, Rfl: 2   Cholecalciferol (VITAMIN D) 50 MCG (2000  UT) CAPS, Take 2,000 Units by mouth daily., Disp: , Rfl:    flecainide (TAMBOCOR) 50 MG tablet, Take 0.5 tablets (25 mg total) by mouth 2 (two) times daily., Disp: 60 tablet, Rfl: 2  fluticasone (FLONASE) 50 MCG/ACT nasal spray, Place 2 sprays into both nostrils daily., Disp: 16 g, Rfl: 2   lisinopril (ZESTRIL) 20 MG tablet, Take 20 mg by mouth daily. Stop / Hold medication starting 06/27/21, Disp: , Rfl:    pantoprazole (PROTONIX) 20 MG tablet, Take 20 mg by mouth daily., Disp: , Rfl:    hydrochlorothiazide (MICROZIDE) 12.5 MG capsule, Take 1 capsule (12.5 mg total) by mouth daily. Stop / Hold medication starting 06/27/21 until further advice, Disp: 30 capsule, Rfl: 0  Radiology:   CT angiogram chest PE protocol 06/16/2021: 1. Cardiovascular: Pulmonary arterial opacification is adequate without evidence of emboli. There is mild thoracic aortic atherosclerosis without aneurysm. Mild LAD coronary atherosclerosis is noted. The heart is normal in size. There is no pericardial effusion. 2. No pleural effusion or pneumothorax. Mild biapical pleuroparenchymal lung scarring. Multiple pulmonary nodules. Most severe: 6 mm left lower lobe solid pulmonary nodule. Recommend a non-contrast Chest CT at 3-6 months.  3. Aortic Atherosclerosis.  Cardiac Studies:   Bilateral lower extremity venous duplex 04/29/2011: No evidence of bilateral lower extremity DVT.  Right and left heart catheterization 05/26/2021: RA 7/7, mean 4 mmHg RV 28/1, EDP 6 mmHg PA 29/10, mean 18 mmHg PW 8/9, mean 8 mmHg. RA saturation 80%, PA saturation 73%, aortic saturation 99%. QP/QS 0.73.  CO 6.54, CI 3.76, Normal.  LV: 124/3, EDP 13 mmHg.  Ao 118/56, mean 81 mmHg.  No pressure gradient across the aortic valve. LV: Hyperdynamic LV, EF 80%. LM: Large vessel, normal. LAD: Large vessel, gives origin to large D1.  Smooth and normal. LCx: Very small, nondominant.  Smooth and normal. RCA: Very large.  Dominant.  Normal.  Impression:  Dyspnea on exertion probably related to hyperdynamic LV and diastolic dysfunction.  Pulmonary pressures are within normal limits.  Normal coronary arteries, right dominant circulation.  30 mL contrast utilized.  Cardiac MRI 10/01/2021:  1.  Normal LV size, wall thickness, and systolic function (EF 64%)  2.  Normal RV size and systolic function (EF 65%)  3.  No late gadolinium enhancement to suggest myocardial scar  4.  Moderate tricuspid regurgitation (regurgitant fraction 32%)  5.  Mild mitral regurgitation (regurgitant fraction 13%)  6.  Small pericardial effusion.  [facility] ECHOCARDIOGRAM COMPLETE 08/26/2021  EKG: NSR w/ frequent PVCs. Normal LV systolic function with visual EF 55-60%. Left ventricle cavity is normal in size. Normal left ventricular wall thickness. Normal global wall motion. Doppler evidence of grade II (pseudonormal) diastolic dysfunction, elevated LAP. Left atrial cavity is mildly dilated. Right atrial cavity is mildly dilated. Mild (Grade I) aortic regurgitation. Mild (Grade I) mitral regurgitation. Moderate to severe tricuspid regurgitation. Moderate pulmonary hypertension. RVSP measures 55 mmHg. Compared to 04/01/2021 Grade 2 DD, elevated LAP, LAE, mild AR are new findings, Moderate TR is now moderate/severe, moderate PHTN may be under appreciated due to dilated RA size. Otherwise no significant change. Consider the frequent PVCs on current study as possible source of dyspnea as well.   EKG:   EKG 12/28/2021: Normal sinus rhythm at rate of 87 bpm, biatrial enlargement, left axis deviation, left intrafascicular block.  Incomplete right bundle branch block.  Poor R progression, cannot exclude anteroseptal infarct old.  Frequent PVCs in bigeminal pattern.  Abnormal EKG. No significant change from 10/19/2021  Assessment     ICD-10-CM   1. 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 Chronic diastolic (congestive) heart failure (HCC)  I50.32     4. Other secondary pulmonary hypertension (HCC)  I27.29     5. Primary hypertension  I10       Medications Discontinued During This Encounter  Medication Reason   fluticasone-salmeterol (ADVAIR HFA) 45-21 MCG/ACT inhaler    sildenafil (REVATIO) 20 MG tablet    hydrochlorothiazide (MICROZIDE) 12.5 MG capsule      Meds ordered this encounter  Medications   flecainide (TAMBOCOR) 50 MG tablet    Sig: Take 0.5 tablets (25 mg total) by mouth 2 (two) times daily.    Dispense:  60 tablet    Refill:  2   acebutolol (SECTRAL) 200 MG capsule    Sig: Take 1 capsule (200 mg total) by mouth 2 (two) times daily.    Dispense:  60 capsule    Refill:  2   hydrochlorothiazide (MICROZIDE) 12.5 MG capsule    Sig: Take 1 capsule (12.5 mg total) by mouth daily. Stop / Hold medication starting 06/27/21 until further advice    Dispense:  30 capsule    Refill:  0   Orders Placed This Encounter  Procedures   LONG TERM MONITOR (3-14 DAYS)    Standing Status:   Future    Number of Occurrences:   1    Standing Expiration Date:   12/29/2022    Order Specific Question:   Where should this test be performed?    Answer:   [facility]    Order Specific Question:   Does the patient have an implanted cardiac device?    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    She was treated with diuretics, lower extremity duplex was negative for DVT, echocardiogram obtained revealed moderate TR and moderate pulmonary hypertension with RV strain.  However right heart catheterization in January 2023 revealed normal coronary arteries, hyperdynamic LVEF, moderately elevated LVEDP but no pulm hypertension.  Cardiac MR on 10/01/2021 did not reveal any significant structural abnormality and it revealed normal RV size and RV function as well.  However patient has improved with regard to her dyspnea and overall wellbeing but continues to have palpitations with frequent PVCs noted on the EKG, also echocardiogram performed in April 2023 revealed worsening tricuspid regurgitation to moderately severe and also PA pressure worse slightly worse at 55 mmHg.  Frequent PVCs were evident.  Again suspicion is whether this is WHO group 2 PAH due to age, hypertension and frequent PVCs.  To improve her heart failure symptoms and dyspnea, I tried sildenafil which she did not tolerate.  She has not tolerated beta-blockers to suppress PVCs.  However I would like to attempt acebutolol 200 mg p.o. twice daily and if she tolerates this add on flecainide at very low-dose of  25 mg twice daily.  Advised the patient if she goes on flecainide if she indeed tolerates this, she needs to come back in 2 weeks for EKG to establish baseline.  I would also like to place Zio patch for 3 days.  Today to evaluate for PVC burden.  Advised her to hold hydrochlorothiazide and also lisinopril for now that she was using for hypertension and which was well controlled until we try acebutolol and flecainide.  Extremely complex patient, and I am trying to figure out her etiology for her presentation.  Importance of elevated EDP, unlikely to thromboembolic PAH, group 4.  I would like to repeat echocardiogram in 2 to 3 months or sooner and if I am unable to come up with any diagnosis, I may even consider doing  a CT angiogram of the chest.  This was a 40-minute office visit encounter.   Adrian Prows, PA-C 12/28/2021, 6:51 PM Office: [phone] CC: Carney Corners, DO (Pulmonary)

## 2021-12-28 NOTE — Telephone Encounter (Signed)
Ok to stop Advair. Use albuterol PRN SOB or wheezing until PFTs. Thanks.

## 2021-12-28 NOTE — Telephone Encounter (Signed)
I called and spoke with the pt and notified of response per [facility]. She verbalized understanding. She wants to hold off on albuterol for now as well. Will discuss at return visit.

## 2021-12-28 NOTE — Telephone Encounter (Signed)
Spoke with the pt  She states she started advair hfa 43/21 on 12/25/21 She used 2 puffs and then 30 min later she developed a sever HA, dizziness, nausea and diarrhea  She has not taken anymore advair since then and symptoms resolved within 24 hours  Katie, please advise if you want her to try something else  She says she paid $150 for the advair and if tries something else, she can't afford for it to cost too much  Please advise, thanks!  Allergies  Allergen Reactions   Verapamil Other (See Comments)    Edema   Bacitracin-Polymyxin B     Other reaction(s): rash   Bystolic [Nebivolol Hcl] Diarrhea, Nausea Only and Swelling   Codeine Nausea Only   Diltiazem     Bad wheezing, ankle swelling, heaviness across chest, left foot tingling    Hydralazine Hcl     Other reaction(s): feet and ankles swelling   Metoprolol     Tongue swelling, diarrhea, GI upset    Olmesartan     Other reaction(s): diarrhea, face swelling   Olmesartan Medoxomil-Hctz     Dizziness, facial swelling    Other Nausea And Vomiting    All seafood   Plendil [Felodipine]     Increase BP, leg selling   Sildenafil Citrate     Dizzy, nausea   Iodine Rash    fever   Latex Rash    redness   Neosporin [Neomycin-Bacitracin Zn-Polymyx] Rash    redness   Tape Rash

## 2022-01-06 ENCOUNTER — Telehealth: Payer: Self-pay

## 2022-01-06 NOTE — Telephone Encounter (Signed)
VM 1110 : Patient called and left a message on VM.  Message is as follows:  "this is Christine Hall date of birth [birth year] I'm a patient of Dr Einar Gip and he prescribed acebutolol to me I have taken it 2 days and I am so nauseated and dizzy I feel like I want to throw up most of the time and  I have headaches.If you could ask him if he'd like to prescribe another medicine or if he just like me to go back on the Lisinopril and I'll see you in 3 months, but if if you could let me know what he wants me to do.My phone number is [phone]."  Please advise.

## 2022-01-06 NOTE — Telephone Encounter (Signed)
She can go back to Lisinopril. COntinue Flecainide. I will see her in 3 months. Please enccourage mychart participation so I could have answered her back straight to her

## 2022-01-08 NOTE — Telephone Encounter (Signed)
Called and spoke with patient. Patient stated that she had not started the Felcainide, because you wanted to see how she did on the Acebutelol, but she will pick it up today.

## 2022-01-20 ENCOUNTER — Ambulatory Visit (INDEPENDENT_AMBULATORY_CARE_PROVIDER_SITE_OTHER): Payer: Medicare Other | Admitting: Pulmonary Disease

## 2022-01-20 ENCOUNTER — Encounter: Payer: Self-pay | Admitting: Pulmonary Disease

## 2022-01-20 VITALS — BP 150/70 | HR 80 | Ht 64.0 in | Wt 147.0 lb

## 2022-01-20 DIAGNOSIS — R059 Cough, unspecified: Secondary | ICD-10-CM

## 2022-01-20 DIAGNOSIS — R0602 Shortness of breath: Secondary | ICD-10-CM

## 2022-01-20 DIAGNOSIS — R918 Other nonspecific abnormal finding of lung field: Secondary | ICD-10-CM | POA: Diagnosis not present

## 2022-01-20 LAB — PULMONARY FUNCTION TEST
DL/VA % pred: 92 %
DL/VA: 3.78 ml/min/mmHg/L
DLCO cor % pred: 78 %
DLCO cor: 14.82 ml/min/mmHg
DLCO unc % pred: 78 %
DLCO unc: 14.82 ml/min/mmHg
FEF 25-75 Post: 1.66 L/sec
FEF 25-75 Pre: 1.21 L/sec
FEF2575-%Change-Post: 36 %
FEF2575-%Pred-Post: 109 %
FEF2575-%Pred-Pre: 80 %
FEV1-%Change-Post: 5 %
FEV1-%Pred-Post: 93 %
FEV1-%Pred-Pre: 88 %
FEV1-Post: 1.88 L
FEV1-Pre: 1.77 L
FEV1FVC-%Change-Post: 10 %
FEV1FVC-%Pred-Pre: 100 %
FEV6-%Change-Post: -3 %
FEV6-%Pred-Post: 89 %
FEV6-%Pred-Pre: 92 %
FEV6-Post: 2.28 L
FEV6-Pre: 2.37 L
FEV6FVC-%Change-Post: 0 %
FEV6FVC-%Pred-Post: 105 %
FEV6FVC-%Pred-Pre: 104 %
FVC-%Change-Post: -3 %
FVC-%Pred-Post: 85 %
FVC-%Pred-Pre: 88 %
FVC-Post: 2.29 L
FVC-Pre: 2.39 L
Post FEV1/FVC ratio: 82 %
Post FEV6/FVC ratio: 100 %
Pre FEV1/FVC ratio: 74 %
Pre FEV6/FVC Ratio: 99 %
RV % pred: 116 %
RV: 2.73 L
TLC % pred: 93 %
TLC: 4.74 L

## 2022-01-20 NOTE — Progress Notes (Signed)
Full PFT Performed Today  

## 2022-01-20 NOTE — Patient Instructions (Addendum)
Thank you for visiting Dr. Valeta Harms at [facility]. Today we recommend the following:  Call us if your symptoms change  Return in about 1 year (around 01/21/2023), or if symptoms worsen or fail to improve, for with APP or Dr. Valeta Harms.    Please do your part to reduce the spread of COVID-19.

## 2022-01-20 NOTE — Patient Instructions (Signed)
Full PFT Performed Today  
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
## 2022-03-08 ENCOUNTER — Ambulatory Visit
Admission: RE | Admit: 2022-03-08 | Discharge: 2022-03-08 | Disposition: A | Discharge status: Home / Self Care | Payer: Medicare Other | Source: Ambulatory Visit | Attending: Internal Medicine | Admitting: Internal Medicine

## 2022-03-08 ENCOUNTER — Other Ambulatory Visit: Payer: Self-pay | Admitting: Internal Medicine

## 2022-03-08 DIAGNOSIS — R921 Mammographic calcification found on diagnostic imaging of breast: Secondary | ICD-10-CM

## 2022-03-31 ENCOUNTER — Encounter: Payer: Self-pay | Admitting: Cardiology

## 2022-03-31 ENCOUNTER — Ambulatory Visit: Payer: Medicare Other | Admitting: Cardiology

## 2022-03-31 VITALS — BP 165/67 | HR 80 | Resp 16 | Ht 63.0 in | Wt 148.0 lb

## 2022-03-31 DIAGNOSIS — I10 Essential (primary) hypertension: Secondary | ICD-10-CM

## 2022-03-31 DIAGNOSIS — I493 Ventricular premature depolarization: Secondary | ICD-10-CM

## 2022-03-31 DIAGNOSIS — I2729 Other secondary pulmonary hypertension: Secondary | ICD-10-CM

## 2022-03-31 DIAGNOSIS — I5032 Chronic diastolic (congestive) heart failure: Secondary | ICD-10-CM

## 2022-03-31 MED ORDER — ATENOLOL 25 MG PO TABS: 25.0000 mg | ORAL_TABLET | Freq: Two times a day (BID) | ORAL | 2 refills | Status: DC

## 2022-03-31 NOTE — Progress Notes (Signed)
Primary Physician/Referring:  Wenda Low, MD  Patient ID: Christine Hall, female    DOB: Mar 27, 1941, 81 y.o.   MRN: [id number]  Chief Complaint  Patient presents with   Congestive Heart Failure   Hypertension   Follow-up    3 month   HPI:    Christine Hall  is a 81 y.o.  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   She was found to have frequent PVC.   She was treated with diuretics, lower extremity duplex was negative for DVT, echocardiogram obtained revealed moderate TR and moderate pulmonary hypertension with RV strain.  However right heart catheterization in January 2023 revealed normal coronary arteries, hyperdynamic LVEF, moderately elevated LVEDP but no pulm hypertension.  Cardiac MR on 10/01/2021 did not reveal any significant structural abnormality and it revealed normal RV size and RV function as well.  She has gradually improved with regard to dyspnea, she has not had any further leg edema, continues to have palpitations, marked fatigue, continued chronic dyspnea.  All the symptoms are new since November 2022.  Past Medical History:  Diagnosis Date   Basal cell carcinoma 09/11/2008   left inner eye tx mohs   BCC (basal cell carcinoma of skin) 10/18/2013   left cheek tx mohs   Hypertension    Past Surgical History:  Procedure Laterality Date   LAPAROSCOPIC CHOLECYSTECTOMY  2002   macular pucker Left 2012   RIGHT/LEFT HEART CATH AND CORONARY ANGIOGRAPHY N/A 05/26/2021   Procedure: RIGHT/LEFT HEART CATH AND CORONARY ANGIOGRAPHY;  Surgeon: Adrian Prows, MD;  Location: [facility];  Service: Cardiovascular;  Laterality: N/A;   TOTAL ABDOMINAL HYSTERECTOMY  1982   Family History  Problem  Relation Age of Onset   Heart attack Mother    Heart attack Father    Colon cancer Father 60   Atrial fibrillation Sister    Heart attack Brother     Social History   Tobacco Use   Smoking status: Never   Smokeless tobacco: Never  Substance Use Topics   Alcohol use: No   Marital Status: Married  ROS  Review of Systems  Constitutional: Positive for malaise/fatigue.  Cardiovascular:  Positive for dyspnea on exertion (stable) and palpitations (stable). Negative for chest pain and leg swelling.  Gastrointestinal:  Negative for melena.   Objective  Blood pressure (!) 165/67, pulse 80, resp. rate 16, height _0  (1.6 m), weight 148 lb (67.1 kg), SpO2 97 %. Body mass index is 26.22 kg/m.     03/31/2022   10:45 AM 01/20/2022   12:00 PM 12/28/2021    2:46 PM  Vitals with BMI  Height _1  _2    Weight 148 lbs 147 lbs   BMI 65.79 03.83   Systolic 338 329 191  Diastolic 67 70 64  Pulse 80 80     Physical Exam Neck:     Vascular: No JVD.  Cardiovascular:     Rate and Rhythm: Normal rate and regular rhythm. Frequent Extrasystoles are present.    Pulses: Intact distal pulses.     Heart sounds: Normal heart sounds. No murmur heard.    No gallop.  Pulmonary:     Effort: Pulmonary effort is normal.     Breath sounds: Normal  breath sounds.  Abdominal:     General: Bowel sounds are normal.     Palpations: Abdomen is soft.  Musculoskeletal:     Right lower leg: No edema.     Left lower leg: No edema.     Laboratory examination:      Latest Ref Rng & Units 07/03/2021   11:13 AM 05/26/2021    1:06 PM 05/26/2021    1:05 PM  CMP  Glucose 70 - 99 mg/dL 84     BUN 8 - 27 mg/dL 25     Creatinine 0.57 - 1.00 mg/dL 1.18     Sodium 134 - 144 mmol/L 141  143  144   Potassium 3.5 - 5.2 mmol/L 4.7  4.1  3.9   Chloride 96 - 106 mmol/L 103     CO2 20 - 29 mmol/L 23     Calcium 8.7 - 10.3 mg/dL 9.0         Latest Ref Rng & Units 05/26/2021    1:06 PM 05/26/2021    1:05 PM  05/26/2021    1:01 PM  CBC  Hemoglobin 12.0 - 15.0 g/dL 12.6  12.6  12.9   Hematocrit 36.0 - 46.0 % 37.0  37.0  38.0     External labs:   05/21/2020:  ANA speckled pattern positive, negative for lupus or systemic sclerosis, polymyositis or dermatomyositis.  proBNP is normal, mild renal insufficiency, labs are stable for cardiac catheterization.  Cholesterol, total 215.000 m 05/21/2020 HDL 91.000 mg 05/21/2020 LDL 109.000 m 05/21/2020 Triglycerides 82.000 mg 05/21/2020  Hemoglobin 12.900 g/d 03/17/2021 Hematocrit 39.1 Platelets 174  BUN 20 Creatinine, Serum 1.040 mg/ 03/17/2021 eGFR >60 Potassium 4.500 mg/ 03/17/2021 ALT (SGPT) 12.000 U/L 05/21/2020  TSH 1.680 03/17/2021  BNP 227.500 P 03/17/2021  Allergies   Allergies  Allergen Reactions   Verapamil Other (See Comments)    Edema   Bacitracin-Polymyxin B     Other reaction(s): rash   Bystolic [Nebivolol Hcl] Diarrhea, Nausea Only and Swelling   Codeine Nausea Only   Diltiazem     Bad wheezing, ankle swelling, heaviness across chest, left foot tingling    Hydralazine Hcl     Other reaction(s): feet and ankles swelling   Metoprolol     Tongue swelling, diarrhea, GI upset    Olmesartan     Other reaction(s): diarrhea, face swelling   Olmesartan Medoxomil-Hctz     Dizziness, facial swelling    Other Nausea And Vomiting    All seafood   Plendil [Felodipine]     Increase BP, leg selling   Sildenafil Citrate     Dizzy, nausea   Iodine Rash    fever   Latex Rash    redness   Neosporin [Neomycin-Bacitracin Zn-Polymyx] Rash    redness   Tape Rash    Medication list after today's encounter    Current Outpatient Medications:    atenolol (TENORMIN) 25 MG tablet, Take 1 tablet (25 mg total) by mouth 2 (two) times daily., Disp: 60 tablet, Rfl: 2   Cholecalciferol (VITAMIN D) 50 MCG (2000 UT) CAPS, Take 2,000 Units by mouth daily., Disp: , Rfl:    lisinopril (ZESTRIL) 20 MG tablet, Take 20 mg by mouth daily. Stop / Hold  medication starting 06/27/21, Disp: , Rfl:    hydrochlorothiazide (HYDRODIURIL) 12.5 MG tablet, Take 1 tablet (12.5 mg total) by mouth daily., Disp: , Rfl:   Radiology:   CT angiogram chest PE protocol 06/16/2021: 1. Cardiovascular: Pulmonary arterial opacification is  adequate without evidence of emboli. There is mild thoracic aortic atherosclerosis without aneurysm. Mild LAD coronary atherosclerosis is noted. The heart is normal in size. There is no pericardial effusion. 2. No pleural effusion or pneumothorax. Mild biapical pleuroparenchymal lung scarring. Multiple pulmonary nodules. Most severe: 6 mm left lower lobe solid pulmonary nodule. Recommend a non-contrast Chest CT at 3-6 months.  3. Aortic Atherosclerosis.  Cardiac Studies:   Bilateral lower extremity venous duplex 04/29/2011: No evidence of bilateral lower extremity DVT.  Right and left heart catheterization 05/26/2021: RA 7/7, mean 4 mmHg RV 28/1, EDP 6 mmHg PA 29/10, mean 18 mmHg PW 8/9, mean 8 mmHg. RA saturation 80%, PA saturation 73%, aortic saturation 99%. QP/QS 0.73.  CO 6.54, CI 3.76, Normal.  LV: 124/3, EDP 13 mmHg.  Ao 118/56, mean 81 mmHg.  No pressure gradient across the aortic valve. LV: Hyperdynamic LV, EF 80%. LM: Large vessel, normal. LAD: Large vessel, gives origin to large D1.  Smooth and normal. LCx: Very small, nondominant.  Smooth and normal. RCA: Very large.  Dominant.  Normal.  Impression: Dyspnea on exertion probably related to hyperdynamic LV and diastolic dysfunction.  Pulmonary pressures are within normal limits.  Normal coronary arteries, right dominant circulation.  30 mL contrast utilized.  Cardiac MRI 10/01/2021:  1.  Normal LV size, wall thickness, and systolic function (EF 56%)  2.  Normal RV size and systolic function (EF 43%)  3.  No late gadolinium enhancement to suggest myocardial scar  4.  Moderate tricuspid regurgitation (regurgitant fraction 32%)  5.  Mild mitral regurgitation  (regurgitant fraction 13%)  6.  Small pericardial effusion.  PCV ECHOCARDIOGRAM COMPLETE 08/26/2021  EKG: NSR w/ frequent PVCs. Normal LV systolic function with visual EF 55-60%. Left ventricle cavity is normal in size. Normal left ventricular wall thickness. Normal global wall motion. Doppler evidence of grade II (pseudonormal) diastolic dysfunction, elevated LAP. Left atrial cavity is mildly dilated. Right atrial cavity is mildly dilated. Mild (Grade I) aortic regurgitation. Mild (Grade I) mitral regurgitation. Moderate to severe tricuspid regurgitation. Moderate pulmonary hypertension. RVSP measures 55 mmHg. Compared to 04/01/2021 Grade 2 DD, elevated LAP, LAE, mild AR are new findings, Moderate TR is now moderate/severe, moderate PHTN may be under appreciated due to dilated RA size. Otherwise no significant change. Consider the frequent PVCs on current study as possible source of dyspnea as well.  [facility] Extended out patient EKG monitoring 3 days starting 12/28/2021:  Predominant rhythm is normal sinus rhythm.  There were no symptoms reported. There was 1 atrial tachycardia episode for 4 beats and 1 episode of 4 consecutive PVCs.  Rare PACs, atrial couplets, atrial triplets (1), rare atrial bigeminy. There was no atrial fibrillation, there is no heart block. There were frequent PVCs, PVC burden 27%.  [phone]).  Rare ventricular couplets (<1%, total 1012).  Rare ventricular triplets.  Ventricular bigeminy and trigeminy were present.   EKG:   EKG 03/31/2022: Normal sinus rhythm with rate of 78 bpm, left atrial enlargement, leftward axis.  Incomplete right bundle branch block.  PVCs (2).  Compared to 12/28/2021, ventricular bigeminy now replaced by sinus rhythm with occasional PVCs.  Assessment     ICD-10-CM   1. 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 Other  secondary pulmonary hypertension (HCC)  I27.29 PCV ECHOCARDIOGRAM COMPLETE    4. Primary hypertension  I10 hydrochlorothiazide (HYDRODIURIL) 12.5 MG tablet      Medications Discontinued During This Encounter  Medication Reason   acebutolol (SECTRAL) 200 MG capsule    fluticasone (FLONASE) 50 MCG/ACT nasal spray    hydrochlorothiazide (MICROZIDE) 12.5 MG capsule    pantoprazole (PROTONIX) 20 MG tablet    acebutolol (SECTRAL) 200 MG capsule Patient Preference   acebutolol (SECTRAL) 200 MG capsule Side effect (s)   fluticasone (FLONASE) 50 MCG/ACT nasal spray No longer needed (for PRN medications)   hydrochlorothiazide (HYDRODIURIL) 12.5 MG tablet Reorder   flecainide (TAMBOCOR) 50 MG tablet Ineffective     Meds ordered this encounter  Medications   atenolol (TENORMIN) 25 MG tablet    Sig: Take 1 tablet (25 mg total) by mouth 2 (two) times daily.    Dispense:  60 tablet    Refill:  2   Orders Placed This Encounter  Procedures   Ambulatory referral to Cardiac Electrophysiology    Referral Priority:   Routine    Referral Type:   Consultation    Referral Reason:   Specialty Services Required    Referred to Provider:   Mealor, Yetta Barre, MD    Requested Specialty:   Cardiology    Number of Visits Requested:   1   EKG 12-Lead   PCV ECHOCARDIOGRAM COMPLETE    Standing Status:   Future    Standing Expiration Date:   04/01/2023    Recommendations:   Christine Hall is a 81 y.o.Caucasian female with primary hypertension, frequent PVCs noted on EKG, multiple medication allergies and sensitivity, fairly active and lives independently and was doing well until October - Nov 2022, suddenly started having marked dyspnea on exertion and marked fatigue even with minimal activity and also leg edema.   She was found to have frequent PVC.   She was treated with diuretics, lower  extremity duplex was negative for DVT, echocardiogram obtained revealed moderate TR and moderate pulmonary hypertension with RV strain.  However right heart catheterization in January 2023 revealed normal coronary arteries, hyperdynamic LVEF, moderately elevated LVEDP but no pulm hypertension.  Cardiac MR on 10/01/2021 did not reveal any significant structural abnormality and it revealed normal RV size and RV function as well.  1. Chronic diastolic (congestive) heart failure (HCC) Her BNP was slightly elevated in the past, fortunately she is now well compensated with no evidence of leg edema, no PND or orthopnea or JVD.  I have restarted her back on the diuretic that she was on due to elevated blood pressure.  She had been tolerating hydrochlorothiazide without any side effects and blood pressure was well-controlled previously and this was discontinued as well as trying new medications for PVCs.  In view of >27% PVC burden, significant symptoms of fatigue, chronic diastolic heart failure, I will refer for EP evaluation.  Repeat echocardiogram pending.  2. Frequent PVCs Patient now having frequent episodes of PVCs in spite of being on flecainide, pregnant has caused her to have significant dizziness, fatigue as well.  Will discontinue flecainide, I will try atenolol 25 mg twice daily.  Previously she has had intolerance to other beta-blockers.  She continues to maintain a list of medications that she is intolerant to.  She is truly intolerant and tries her best to take the medications as prescribed.  3. Other secondary pulmonary hypertension ([place]) Patient's moderate and moderate pulm hypertension was probably related to [facility]  group 2 due to diastolic dysfunction and frequent PVCs.  Will repeat echocardiogram to follow-up on the mitral and tricuspid regurgitation and PA pressures.  4. Primary hypertension I have added atenolol 25 mg twice daily, also restarted her back on hydrochlorothiazide.  Hopefully  this will improve the blood pressure.  I will see her back in 3 months for follow-up.    Adrian Prows, MD, [facility] 03/31/2022, 11:46 AM Office: [phone] Fax: [phone] Pager: [phone]

## 2022-03-31 NOTE — Patient Instructions (Signed)
Premature ventricular complexes (PVC) means extra heartbeat from the lower chamber of the heart.  These are very common and are not dangerous.  Extra skipped beat coming from the bottom chamber (ventricle) and mostly are life altering (nuisance) than life threatening and mostly treated by reassurance.  There may not be any specific reasons for this, however patients with excessive caffeine, anxiety, lack of sleep, alcohol or thyroid problems can have these episodes.  Rarely patients with block coronary arteries or family history of heart muscle disease may be the reason.  If more questions, he can discuss with her doctor on office visit.

## 2022-04-05 ENCOUNTER — Encounter: Payer: Self-pay | Admitting: Cardiology

## 2022-04-05 NOTE — Telephone Encounter (Signed)
Medications Discontinued During This Encounter  Medication Reason   atenolol (TENORMIN) 25 MG tablet Side effect (s)    Allergies  Allergen Reactions   Atenolol Other (See Comments)    Weakness, diarrhea, bloating   Verapamil Other (See Comments)    Edema   Bacitracin-Polymyxin B     Other reaction(s): rash   Bystolic [Nebivolol Hcl] Diarrhea, Nausea Only and Swelling   Codeine Nausea Only   Diltiazem     Bad wheezing, ankle swelling, heaviness across chest, left foot tingling    Hydralazine Hcl     Other reaction(s): feet and ankles swelling   Metoprolol     Tongue swelling, diarrhea, GI upset    Olmesartan     Other reaction(s): diarrhea, face swelling   Olmesartan Medoxomil-Hctz     Dizziness, facial swelling    Other Nausea And Vomiting    All seafood   Plendil [Felodipine]     Increase BP, leg selling   Sildenafil Citrate     Dizzy, nausea   Iodine Rash    fever   Latex Rash    redness   Neosporin [Neomycin-Bacitracin Zn-Polymyx] Rash    redness   Tape Rash

## 2022-04-05 NOTE — Telephone Encounter (Signed)
From pt

## 2022-04-15 ENCOUNTER — Ambulatory Visit: Payer: Medicare Other

## 2022-04-15 DIAGNOSIS — I2729 Other secondary pulmonary hypertension: Secondary | ICD-10-CM

## 2022-04-15 DIAGNOSIS — I493 Ventricular premature depolarization: Secondary | ICD-10-CM

## 2022-04-20 ENCOUNTER — Ambulatory Visit: Payer: Medicare Other | Attending: Cardiovascular Disease | Admitting: Cardiovascular Disease

## 2022-04-20 ENCOUNTER — Encounter: Payer: Self-pay | Admitting: Cardiology

## 2022-04-20 ENCOUNTER — Encounter: Payer: Self-pay | Admitting: Cardiovascular Disease

## 2022-04-20 VITALS — BP 148/72 | HR 81 | Ht 64.0 in | Wt 146.0 lb

## 2022-04-20 DIAGNOSIS — I493 Ventricular premature depolarization: Secondary | ICD-10-CM | POA: Diagnosis present

## 2022-04-20 DIAGNOSIS — I5032 Chronic diastolic (congestive) heart failure: Secondary | ICD-10-CM | POA: Insufficient documentation

## 2022-04-20 NOTE — Telephone Encounter (Signed)
From patient.

## 2022-04-20 NOTE — Progress Notes (Signed)
Electrophysiology Office Note:    Date:  04/20/2022   ID:  Christine Hall, DOB 12-28-1940, MRN [id number]  PCP:  Wenda Low, [place] Providers Cardiologist:  None Electrophysiologist:  Melida Quitter, MD     Referring MD: Adrian Prows, MD   History of Present Illness:    Christine Hall is a 81 y.o. female with a hx listed below, significant for HTN and frequent PVCs, referred for arrhythmia management.  Per referral records, she was very active and functional until October or November of 2022, at which time she was diagnosed with frequent PVCs. She was treated with diuretics.  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  Cardiac MRI showed normal structure and function.  She has tried many medications but has not been able to tolerate them due to various intolerances. She most recently tried flecainide but felt worse taking it.  Past Medical History:  Diagnosis Date   Basal cell carcinoma 09/11/2008   left inner eye tx mohs   BCC (basal cell carcinoma of skin) 10/18/2013   left cheek tx mohs   Hypertension     Past Surgical History:  Procedure Laterality Date   LAPAROSCOPIC CHOLECYSTECTOMY  2002   macular pucker Left 2012   RIGHT/LEFT HEART CATH AND CORONARY ANGIOGRAPHY N/A 05/26/2021   Procedure: RIGHT/LEFT HEART CATH AND CORONARY ANGIOGRAPHY;  Surgeon: Adrian Prows, MD;  Location: [facility];  Service: Cardiovascular;  Laterality: N/A;   TOTAL ABDOMINAL HYSTERECTOMY  1982    Current Medications: No outpatient medications have been marked as taking for the 04/20/22 encounter (Appointment) with Christine Hall, Christine Barre, MD.     Allergies:   Atenolol, Verapamil, Bacitracin-polymyxin b, Bystolic [nebivolol hcl], Codeine, Diltiazem, Hydralazine hcl, Metoprolol, Olmesartan, Olmesartan medoxomil-hctz, Other, Plendil [felodipine], Sildenafil citrate,  Iodine, Latex, Neosporin [neomycin-bacitracin zn-polymyx], and Tape   Social History   Socioeconomic History   Marital status: Married    Spouse name: Not on file   Number of children: 0   Years of education: Not on file   Highest education level: Not on file  Occupational History   Not on file  Tobacco Use   Smoking status: Never   Smokeless tobacco: Never  Vaping Use   Vaping Use: Never used  Substance and Sexual Activity   Alcohol use: No   Drug use: No   Sexual activity: Not on file  Other Topics Concern   Not on file  Social History Narrative   2 adopted children   Social Determinants of Health   Financial Resource Strain: Not on file  Food Insecurity: Not on file  Transportation Needs: Not on file  Physical Activity: Not on file  Stress: Not on file  Social Connections: Not on file     Family History: The patient's family history includes Atrial fibrillation in her sister; Colon cancer (age of onset: 80) in her father; Heart attack in her brother, father, and mother.  ROS:   Please see the history of present illness.    All other systems reviewed and are negative.  EKGs/Labs/Other Studies Reviewed Today:     Cardiac MR 10/01/2021: normal structure and function  EKG:  Last EKG results: today. Sinus rhythm with PVCs  I reviewed multiple ECGs in EPIC. All show frequent PVCs, occasionally with bigeminy. PVCs are monomorphic, with precordial transition in V4, ++II, +aVF, iso III. PVC QRS is fractionated, and MDI is fairly late  in several leads.  Recent Labs: 05/18/2021: NT-Pro BNP 653 05/26/2021: Hemoglobin 12.6; Platelets 183 07/03/2021: BUN 25; Creatinine, Ser 1.18; Potassium 4.7; Sodium 141     Physical Exam:    VS:  There were no vitals taken for this visit.    Wt Readings from Last 3 Encounters:  03/31/22 148 lb (67.1 kg)  01/20/22 147 lb (66.7 kg)  12/28/21 148 lb (67.1 kg)     GEN: Well nourished, well developed in no acute distress CARDIAC:  Irregular rhythm, no murmurs, rubs, gallops RESPIRATORY:  Normal work of breathing MUSCULOSKELETAL: no edema    ASSESSMENT & PLAN:    Frequent unifocal PVCs: Possibly right-sided, inferior origin. She did not tolerate flecainide, other medications including some betablockers and diltiazem. We discussed the indication, rationale, logistics, anticipated benefits, and potential risks of the ablation procedure including but not limited to -- bleed at the groin access site, chest pain, damage to nearby organs such as the diaphragm, lungs, or esophagus, need for a drainage tube, or prolonged hospitalization. I explained that the risk for stroke, heart attack, need for open chest surgery, or even death is very low but not zero. she  expressed understanding but does not want to schedule the procedure at this time. I recommended she try metoprolol taurate, which is available in some vitamin stores but usually obtained online.  Chronic CHF with preserved EF. I suspect PVCs are contributing Hypertension:  on HCTZ  At this point, she is contemplating ablation but understandably has reservations. At the minimum, she will need regular TTEs to monitor LV function. Otherwise, I will be happy to arrange ablation if she decides to proceed.       Medication Adjustments/Labs and Tests Ordered: Current medicines are reviewed at length with the patient today.  Concerns regarding medicines are outlined above.  No orders of the defined types were placed in this encounter.  No orders of the defined types were placed in this encounter.    Signed, Melida Quitter, MD  04/20/2022 1:50 PM    [facility]

## 2022-04-20 NOTE — Patient Instructions (Addendum)
Medication Instructions:  Your physician has recommended you make the following change in your medication:  1) START taking magnesium taurate *If you need a refill on your cardiac medications before your next appointment, please call your pharmacy*  Follow-Up: At [facility], you and your health needs are our priority.  As part of our continuing mission to provide you with exceptional heart care, we have created designated Provider Care Teams.  These Care Teams include your primary Cardiologist (physician) and Advanced Practice Providers (APPs -  Physician Assistants and Nurse Practitioners) who all work together to provide you with the care you need, when you need it.   Follow up as needed  Important Information About Sugar

## 2022-04-24 ENCOUNTER — Other Ambulatory Visit: Payer: Self-pay | Admitting: Cardiology

## 2022-04-26 ENCOUNTER — Other Ambulatory Visit: Payer: Self-pay

## 2022-04-26 DIAGNOSIS — I10 Essential (primary) hypertension: Secondary | ICD-10-CM

## 2022-04-26 DIAGNOSIS — I5032 Chronic diastolic (congestive) heart failure: Secondary | ICD-10-CM

## 2022-04-26 MED ORDER — HYDROCHLOROTHIAZIDE 12.5 MG PO TABS: 12.5000 mg | ORAL_TABLET | Freq: Every day | ORAL | 1 refills | Status: DC

## 2022-04-27 ENCOUNTER — Telehealth: Payer: Self-pay | Admitting: Cardiovascular Disease

## 2022-04-27 DIAGNOSIS — I493 Ventricular premature depolarization: Secondary | ICD-10-CM

## 2022-04-27 NOTE — Telephone Encounter (Signed)
Spoke with the patient and scheduled her for her ablation.

## 2022-04-27 NOTE — Telephone Encounter (Signed)
Pt is requesting a call back to discuss going ahead and scheduling a time for an ablation procedure. She states she has changed her mind and would like to go ahead and schedule.

## 2022-07-01 ENCOUNTER — Encounter: Payer: Self-pay | Admitting: Cardiology

## 2022-07-01 ENCOUNTER — Ambulatory Visit: Payer: Medicare Other | Admitting: Cardiology

## 2022-07-01 VITALS — BP 125/67 | HR 64 | Resp 16 | Ht 64.0 in | Wt 148.4 lb

## 2022-07-01 DIAGNOSIS — I493 Ventricular premature depolarization: Secondary | ICD-10-CM

## 2022-07-01 DIAGNOSIS — I5032 Chronic diastolic (congestive) heart failure: Secondary | ICD-10-CM

## 2022-07-01 DIAGNOSIS — I2729 Other secondary pulmonary hypertension: Secondary | ICD-10-CM

## 2022-07-01 NOTE — Progress Notes (Signed)
Primary Physician/Referring:  Wenda Low, MD  Patient ID: Christine Hall, female    DOB: Aug 15, 1940, 82 y.o.   MRN: [id number]  Chief Complaint  Patient presents with   Chronic diastolic (congestive) heart failure ([place])   Palpitations   Pulmonary hypertension ([place])   Follow-up   HPI:    Christine Hall  is a 82 y.o.  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   She was found to have frequent PVC.   She was treated with diuretics, lower extremity duplex was negative for DVT, echocardiogram obtained revealed moderate TR and moderate pulmonary hypertension with RV strain.  However right heart catheterization in January 2023 revealed normal coronary arteries, hyperdynamic LVEF, moderately elevated LVEDP but no pulm hypertension.  Cardiac MR on 10/01/2021 did not reveal any significant structural abnormality and it revealed normal RV size and RV function as well.  Since being on lisinopril and HCT combination, she has not had any further leg edema, dyspnea has remained stable.  She continues to endorse marked fatigue and continued palpitations.  She has not been evaluated by EP.  Past Medical History:  Diagnosis Date   Basal cell carcinoma 09/11/2008   left inner eye tx mohs   BCC (basal cell carcinoma of skin) 10/18/2013   left cheek tx mohs   Hypertension    Past Surgical History:  Procedure Laterality Date   LAPAROSCOPIC CHOLECYSTECTOMY  2002   macular pucker Left 2012   RIGHT/LEFT HEART CATH AND CORONARY ANGIOGRAPHY N/A 05/26/2021   Procedure: RIGHT/LEFT HEART CATH AND CORONARY ANGIOGRAPHY;  Surgeon: Adrian Prows, MD;  Location: [facility];  Service: Cardiovascular;  Laterality: N/A;   TOTAL ABDOMINAL  HYSTERECTOMY  1982   Family History  Problem Relation Age of Onset   Heart attack Mother    Heart attack Father    Colon cancer Father 55   Atrial fibrillation Sister    Heart attack Brother     Social History   Tobacco Use   Smoking status: Never   Smokeless tobacco: Never  Substance Use Topics   Alcohol use: No   Marital Status: Married  ROS  Review of Systems  Constitutional: Positive for malaise/fatigue.  Cardiovascular:  Positive for dyspnea on exertion (stable) and palpitations. Negative for chest pain and leg swelling.  Gastrointestinal:  Negative for melena.   Objective  Blood pressure (!) 155/67, pulse 64, resp. rate 16, height 5' 4"$  (1.626 m), weight 148 lb 6.4 oz (67.3 kg), SpO2 100 %. Body mass index is 25.47 kg/m.     07/01/2022   11:19 AM 04/20/2022    2:05 PM 03/31/2022   10:45 AM  Vitals with BMI  Height 5' 4"$  5' 4"$  5' 3"$   Weight 148 lbs 6 oz 146 lbs 148 lbs  BMI 25.46 [id number]  Systolic [id number]  Diastolic 67 72 67  Pulse 64 81 80    Physical Exam Neck:     Vascular: No JVD.  Cardiovascular:     Rate and Rhythm: Normal rate and regular rhythm. Frequent Extrasystoles are present.    Pulses: Intact distal pulses.     Heart sounds: Normal heart sounds. No murmur heard.    No gallop.  Pulmonary:     Effort:  Pulmonary effort is normal.     Breath sounds: Normal breath sounds.  Abdominal:     General: Bowel sounds are normal.     Palpations: Abdomen is soft.  Musculoskeletal:     Right lower leg: No edema.     Left lower leg: No edema.     Laboratory examination:      Latest Ref Rng & Units 07/03/2021   11:13 AM 05/26/2021    1:06 PM 05/26/2021    1:05 PM  CMP  Glucose 70 - 99 mg/dL 84     BUN 8 - 27 mg/dL 25     Creatinine 0.57 - 1.00 mg/dL 1.18     Sodium 134 - 144 mmol/L 141  143  144   Potassium 3.5 - 5.2 mmol/L 4.7  4.1  3.9   Chloride 96 - 106 mmol/L 103     CO2 20 - 29 mmol/L 23     Calcium 8.7 - 10.3 mg/dL 9.0          Latest Ref Rng & Units 05/26/2021    1:06 PM 05/26/2021    1:05 PM 05/26/2021    1:01 PM  CBC  Hemoglobin 12.0 - 15.0 g/dL 12.6  12.6  12.9   Hematocrit 36.0 - 46.0 % 37.0  37.0  38.0     External labs:   05/21/2020:  ANA speckled pattern positive, negative for lupus or systemic sclerosis, polymyositis or dermatomyositis.  proBNP is normal, mild renal insufficiency, labs are stable for cardiac catheterization.  Cholesterol, total 215.000 m 05/21/2020 HDL 91.000 mg 05/21/2020 LDL 109.000 m 05/21/2020 Triglycerides 82.000 mg 05/21/2020  Hemoglobin 12.900 g/d 03/17/2021 Hematocrit 39.1 Platelets 174  BUN 20 Creatinine, Serum 1.040 mg/ 03/17/2021 eGFR >60 Potassium 4.500 mg/ 03/17/2021 ALT (SGPT) 12.000 U/L 05/21/2020  TSH 1.680 03/17/2021  BNP 227.500 P 03/17/2021  Allergies   Allergies  Allergen Reactions   Atenolol Other (See Comments)    Weakness, diarrhea, bloating   Verapamil Other (See Comments)    Edema   Bacitracin-Polymyxin B     Other reaction(s): rash   Bystolic [Nebivolol Hcl] Diarrhea, Nausea Only and Swelling   Codeine Nausea Only   Diltiazem     Bad wheezing, ankle swelling, heaviness across chest, left foot tingling    Flecainide Nausea Only    headaches   Hydralazine Hcl     Other reaction(s): feet and ankles swelling   Metoprolol     Tongue swelling, diarrhea, GI upset    Olmesartan     Other reaction(s): diarrhea, face swelling   Olmesartan Medoxomil-Hctz     Dizziness, facial swelling    Other Nausea And Vomiting    All seafood   Plendil [Felodipine]     Increase BP, leg selling   Sildenafil Citrate     Dizzy, nausea   Iodine Rash    fever   Latex Rash    redness   Neosporin [Neomycin-Bacitracin Zn-Polymyx] Rash    redness   Tape Rash    Medication list after today's encounter    Current Outpatient Medications:    Cholecalciferol (VITAMIN D) 50 MCG (2000 UT) CAPS, Take 2,000 Units by mouth daily., Disp: , Rfl:    hydrochlorothiazide  (HYDRODIURIL) 12.5 MG tablet, Take 1 tablet (12.5 mg total) by mouth daily., Disp: 90 tablet, Rfl: 1   lisinopril (ZESTRIL) 20 MG tablet, Take 20 mg by mouth daily. Stop / Hold medication starting 06/27/21, Disp: , Rfl:   Radiology:   CT angiogram chest  PE protocol 06/16/2021: 1. Cardiovascular: Pulmonary arterial opacification is adequate without evidence of emboli. There is mild thoracic aortic atherosclerosis without aneurysm. Mild LAD coronary atherosclerosis is noted. The heart is normal in size. There is no pericardial effusion. 2. No pleural effusion or pneumothorax. Mild biapical pleuroparenchymal lung scarring. Multiple pulmonary nodules. Most severe: 6 mm left lower lobe solid pulmonary nodule. Recommend a non-contrast Chest CT at 3-6 months.  3. Aortic Atherosclerosis.  Cardiac Studies:   Bilateral lower extremity venous duplex 04/29/2011: No evidence of bilateral lower extremity DVT.  Right and left heart catheterization 05/26/2021: RA 7/7, mean 4 mmHg RV 28/1, EDP 6 mmHg PA 29/10, mean 18 mmHg PW 8/9, mean 8 mmHg. RA saturation 80%, PA saturation 73%, aortic saturation 99%. QP/QS 0.73.  CO 6.54, CI 3.76, Normal.  LV: 124/3, EDP 13 mmHg.  Ao 118/56, mean 81 mmHg.  No pressure gradient across the aortic valve. LV: Hyperdynamic LV, EF 80%. LM: Large vessel, normal. LAD: Large vessel, gives origin to large D1.  Smooth and normal. LCx: Very small, nondominant.  Smooth and normal. RCA: Very large.  Dominant.  Normal.  Impression: Dyspnea on exertion probably related to hyperdynamic LV and diastolic dysfunction.  Pulmonary pressures are within normal limits.  Normal coronary arteries, right dominant circulation.  30 mL contrast utilized.  Cardiac MRI 10/01/2021:  1.  Normal LV size, wall thickness, and systolic function (EF [id number])  2.  Normal RV size and systolic function (EF 123456)  3.  No late gadolinium enhancement to suggest myocardial scar  4.  Moderate tricuspid regurgitation  (regurgitant fraction 32%)  5.  Mild mitral regurgitation (regurgitant fraction 13%)  6.  Small pericardial effusion.  PCV ECHOCARDIOGRAM COMPLETE 08/26/2021  EKG: NSR w/ frequent PVCs. Normal LV systolic function with visual EF 55-60%. Left ventricle cavity is normal in size. Normal left ventricular wall thickness. Normal global wall motion. Doppler evidence of grade II (pseudonormal) diastolic dysfunction, elevated LAP. Left atrial cavity is mildly dilated. Right atrial cavity is mildly dilated. Mild (Grade I) aortic regurgitation. Mild (Grade I) mitral regurgitation. Moderate to severe tricuspid regurgitation. Moderate pulmonary hypertension. RVSP measures 55 mmHg. Compared to 04/01/2021 Grade 2 DD, elevated LAP, LAE, mild AR are new findings, Moderate TR is now moderate/severe, moderate PHTN may be under appreciated due to dilated RA size. Otherwise no significant change. Consider the frequent PVCs on current study as possible source of dyspnea as well.  Zio Patch Extended out patient EKG monitoring 3 days starting 12/28/2021:  Predominant rhythm is normal sinus rhythm.  There were no symptoms reported. There was 1 atrial tachycardia episode for 4 beats and 1 episode of 4 consecutive PVCs.  Rare PACs, atrial couplets, atrial triplets (1), rare atrial bigeminy. There was no atrial fibrillation, there is no heart block. There were frequent PVCs, PVC burden 27%.  [phone]).  Rare ventricular couplets (<1%, total 1012).  Rare ventricular triplets.  Ventricular bigeminy and trigeminy were present.  PCV ECHOCARDIOGRAM COMPLETE 04/15/2022  Narrative Echocardiogram 04/15/2022: Normal LV systolic function with EF 67%. Left ventricle cavity is normal in size. Normal left ventricular wall thickness. Normal global wall motion. Normal diastolic filling pattern. Calculated EF 67%. Trileaflet aortic valve.  Mild (Grade I) aortic regurgitation. Mild (Grade I) mitral regurgitation. Mild prolapse of the  mitral valve leaflets. Structurally normal tricuspid valve.  Moderate tricuspid regurgitation. Mild pulmonary hypertension. RVSP measures 34 mmHg (CVP 3 mm Hg). Compared to the study done on 08/26/2021, left ventricular pseudonormal pattern not present, moderate pulmonary  hypertension has improved to at most mild pulm hypertension.  Frequent PVCs not present.   EKG:   EKG 03/31/2022: Normal sinus rhythm with rate of 78 bpm, left atrial enlargement, leftward axis.  Incomplete right bundle branch block.  PVCs (2).  Compared to 12/28/2021, ventricular bigeminy now replaced by sinus rhythm with occasional PVCs.  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 Chronic diastolic (congestive) heart failure (HCC)  I50.32       There are no discontinued medications.    No orders of the defined types were placed in this encounter.  No orders of the defined types were placed in this encounter.   Recommendations:   Christine Hall is a 82 y.o.Caucasian female with primary hypertension, frequent PVCs noted on EKG, multiple medication allergies and sensitivity, fairly active and lives independently and was doing well until October - Nov 2022, suddenly started having marked dyspnea on exertion and marked fatigue even with minimal activity and also leg edema.   She was found to have frequent PVC.   She was treated with diuretics, lower extremity duplex was negative for DVT, echocardiogram obtained revealed moderate TR and moderate pulmonary hypertension with RV strain.  However right heart catheterization in January 2023 revealed normal coronary arteries, hyperdynamic LVEF, moderately elevated LVEDP but no pulm hypertension.  Cardiac MR on 10/01/2021 did not reveal any significant structural abnormality and it revealed normal RV size and RV function as well.  1. Frequent unifocal PVCs Could not tolerate any of the medications due to side effects and through allergies and intolerances.   She is now scheduled by Dr. Kaleen Odea, EP for PVC ablation on 07/28/2022.  I encouraged the patient to proceed with the procedure as she is extremely symptomatic with marked fatigue related to palpitations.  2. Chronic diastolic (congestive) heart failure (HCC) Chronic diastolic heart failure contributed by frequent PVCs, age and diastolic dysfunction.  Fortunately no acute decompensation.  She is tolerating lisinopril and HCTZ, continue the same.  3. Other secondary pulmonary hypertension (HCC) Mild pulm hypertension I suspect is related to WHO group 2 from diastolic dysfunction and chronic diastolic heart failure.   I will see her back in 3 months for follow-up.    Adrian Prows, MD, [facility] 07/01/2022, 11:53 AM Office: [phone] Fax: [phone] Pager: [phone]

## 2022-07-21 ENCOUNTER — Ambulatory Visit: Payer: Medicare Other | Attending: Cardiovascular Disease

## 2022-07-21 DIAGNOSIS — I493 Ventricular premature depolarization: Secondary | ICD-10-CM

## 2022-07-21 LAB — CBC WITH DIFFERENTIAL/PLATELET

## 2022-07-22 LAB — CBC WITH DIFFERENTIAL/PLATELET
Basophils Absolute: 0 10*3/uL (ref 0.0–0.2)
Basos: 1 %
EOS (ABSOLUTE): 0.1 10*3/uL (ref 0.0–0.4)
Eos: 1 %
Hematocrit: 38.3 % (ref 34.0–46.6)
Hemoglobin: 12.6 g/dL (ref 11.1–15.9)
Immature Grans (Abs): 0 10*3/uL (ref 0.0–0.1)
Immature Granulocytes: 0 %
Lymphocytes Absolute: 1.5 10*3/uL (ref 0.7–3.1)
Lymphs: 23 %
MCH: 29.9 pg (ref 26.6–33.0)
MCHC: 32.9 g/dL (ref 31.5–35.7)
MCV: 91 fL (ref 79–97)
Monocytes Absolute: 0.7 10*3/uL (ref 0.1–0.9)
Monocytes: 12 %
Neutrophils Absolute: 4 10*3/uL (ref 1.4–7.0)
Neutrophils: 63 %
Platelets: 171 10*3/uL (ref 150–450)
RBC: 4.22 x10E6/uL (ref 3.77–5.28)
RDW: 12.2 % (ref 11.7–15.4)
WBC: 6.4 10*3/uL (ref 3.4–10.8)

## 2022-07-22 LAB — BASIC METABOLIC PANEL
BUN/Creatinine Ratio: 22 (ref 12–28)
BUN: 32 mg/dL — ABNORMAL HIGH (ref 8–27)
CO2: 23 mmol/L (ref 20–29)
Calcium: 9.7 mg/dL (ref 8.7–10.3)
Chloride: 105 mmol/L (ref 96–106)
Creatinine, Ser: 1.48 mg/dL — ABNORMAL HIGH (ref 0.57–1.00)
Glucose: 82 mg/dL (ref 70–99)
Potassium: 4.2 mmol/L (ref 3.5–5.2)
Sodium: 142 mmol/L (ref 134–144)
eGFR: 36 mL/min/{1.73_m2} — ABNORMAL LOW (ref >59)

## 2022-07-27 NOTE — Pre-Procedure Instructions (Signed)
Instructed patient on the following items: Arrival time 0930 Nothing to eat or drink after midnight No meds AM of procedure Responsible person to drive you home and stay with you for 24 hrs      

## 2022-07-28 ENCOUNTER — Ambulatory Visit (HOSPITAL_COMMUNITY)
Admission: RE | Admit: 2022-07-28 | Discharge: 2022-07-28 | Disposition: A | Discharge status: Home / Self Care | Payer: Medicare Other | Attending: Cardiovascular Disease | Admitting: Cardiovascular Disease

## 2022-07-28 ENCOUNTER — Other Ambulatory Visit: Payer: Self-pay

## 2022-07-28 ENCOUNTER — Encounter (HOSPITAL_COMMUNITY)
Admission: RE | Disposition: A | Discharge status: Home / Self Care | Payer: Self-pay | Source: Home / Self Care | Attending: Cardiovascular Disease

## 2022-07-28 ENCOUNTER — Ambulatory Visit (HOSPITAL_COMMUNITY): Payer: Medicare Other | Admitting: Registered Nurse

## 2022-07-28 ENCOUNTER — Ambulatory Visit (HOSPITAL_BASED_OUTPATIENT_CLINIC_OR_DEPARTMENT_OTHER): Payer: Medicare Other | Admitting: Registered Nurse

## 2022-07-28 DIAGNOSIS — I110 Hypertensive heart disease with heart failure: Secondary | ICD-10-CM | POA: Diagnosis not present

## 2022-07-28 DIAGNOSIS — Z8249 Family history of ischemic heart disease and other diseases of the circulatory system: Secondary | ICD-10-CM | POA: Insufficient documentation

## 2022-07-28 DIAGNOSIS — Z79899 Other long term (current) drug therapy: Secondary | ICD-10-CM | POA: Insufficient documentation

## 2022-07-28 DIAGNOSIS — I5032 Chronic diastolic (congestive) heart failure: Secondary | ICD-10-CM | POA: Insufficient documentation

## 2022-07-28 DIAGNOSIS — I130 Hypertensive heart and chronic kidney disease with heart failure and stage 1 through stage 4 chronic kidney disease, or unspecified chronic kidney disease: Secondary | ICD-10-CM | POA: Diagnosis not present

## 2022-07-28 DIAGNOSIS — N189 Chronic kidney disease, unspecified: Secondary | ICD-10-CM | POA: Diagnosis not present

## 2022-07-28 DIAGNOSIS — I509 Heart failure, unspecified: Secondary | ICD-10-CM | POA: Diagnosis not present

## 2022-07-28 DIAGNOSIS — I493 Ventricular premature depolarization: Secondary | ICD-10-CM | POA: Diagnosis present

## 2022-07-28 HISTORY — PX: PVC ABLATION: EP1236

## 2022-07-28 SURGERY — PVC ABLATION
Anesthesia: Monitor Anesthesia Care

## 2022-07-28 MED ORDER — BUPIVACAINE HCL (PF) 0.25 % IJ SOLN
INTRAMUSCULAR | Status: DC | PRN
  Administered 2022-07-28: 30 mL

## 2022-07-28 MED ORDER — FENTANYL CITRATE (PF) 100 MCG/2ML IJ SOLN
INTRAMUSCULAR | Status: DC | PRN
  Administered 2022-07-28 (×8): 25 ug via INTRAVENOUS

## 2022-07-28 MED ORDER — ONDANSETRON HCL 4 MG/2ML IJ SOLN: 4.0000 mg | Freq: Four times a day (QID) | INTRAMUSCULAR | Status: DC | PRN

## 2022-07-28 MED ORDER — MIDAZOLAM HCL 2 MG/2ML IJ SOLN
INTRAMUSCULAR | Status: DC | PRN
  Administered 2022-07-28 (×4): .5 mg via INTRAVENOUS

## 2022-07-28 MED ORDER — ONDANSETRON HCL 4 MG/2ML IJ SOLN
INTRAMUSCULAR | Status: DC | PRN
  Administered 2022-07-28: 4 mg via INTRAVENOUS

## 2022-07-28 MED ORDER — SODIUM CHLORIDE 0.9 % IV SOLN: INTRAVENOUS | Status: DC

## 2022-07-28 MED ORDER — SODIUM CHLORIDE 0.9 % IV SOLN: 250.0000 mL | INTRAVENOUS | Status: DC | PRN

## 2022-07-28 MED ORDER — BUPIVACAINE HCL (PF) 0.25 % IJ SOLN
INTRAMUSCULAR | Status: AC
  Filled 2022-07-28: qty 30

## 2022-07-28 MED ORDER — SODIUM CHLORIDE 0.9% FLUSH: 3.0000 mL | INTRAVENOUS | Status: DC | PRN

## 2022-07-28 MED ORDER — HEPARIN (PORCINE) IN NACL 1000-0.9 UT/500ML-% IV SOLN
INTRAVENOUS | Status: DC | PRN
  Administered 2022-07-28 (×3): 500 mL

## 2022-07-28 MED ORDER — HEPARIN SODIUM (PORCINE) 1000 UNIT/ML IJ SOLN
INTRAMUSCULAR | Status: DC | PRN
  Administered 2022-07-28: 1000 [IU] via INTRAVENOUS

## 2022-07-28 MED ORDER — ACETAMINOPHEN 325 MG PO TABS: 650.0000 mg | ORAL_TABLET | ORAL | Status: DC | PRN

## 2022-07-28 MED ORDER — HEPARIN SODIUM (PORCINE) 1000 UNIT/ML IJ SOLN
INTRAMUSCULAR | Status: DC | PRN
  Administered 2022-07-28: 2000 [IU] via INTRAVENOUS
  Administered 2022-07-28: 10000 [IU] via INTRAVENOUS
  Administered 2022-07-28: 2000 [IU] via INTRAVENOUS

## 2022-07-28 MED ORDER — HEPARIN SODIUM (PORCINE) 1000 UNIT/ML IJ SOLN
INTRAMUSCULAR | Status: AC
  Filled 2022-07-28: qty 10

## 2022-07-28 MED ORDER — SODIUM CHLORIDE 0.9% FLUSH: 3.0000 mL | Freq: Two times a day (BID) | INTRAVENOUS | Status: DC

## 2022-07-28 MED ORDER — PROTAMINE SULFATE 10 MG/ML IV SOLN
INTRAVENOUS | Status: DC | PRN
  Administered 2022-07-28: 50 mg via INTRAVENOUS

## 2022-07-28 SURGICAL SUPPLY — 17 items
CATH 8FR REPROCESSED SOUNDSTAR (CATHETERS) ×1 IMPLANT
CATH 8FR SOUNDSTAR REPROCESSED (CATHETERS) IMPLANT
CATH DECANAV F CURVE (CATHETERS) IMPLANT
CATH PIGTAIL STEERABLE D1 8.7 (WIRE) IMPLANT
CATH SMTCH THERMOCOOL SF DF (CATHETERS) IMPLANT
CLOSURE PERCLOSE PROSTYLE (VASCULAR PRODUCTS) IMPLANT
COVER SWIFTLINK CONNECTOR (BAG) IMPLANT
DEVICE CLOSURE MYNXGRIP 6/7F (Vascular Products) IMPLANT
PACK EP LATEX FREE (CUSTOM PROCEDURE TRAY) ×1
PACK EP LF (CUSTOM PROCEDURE TRAY) ×2 IMPLANT
PAD DEFIB RADIO PHYSIO CONN (PAD) ×2 IMPLANT
PATCH CARTO3 (PAD) IMPLANT
SHEATH PINNACLE 5F 10CM (SHEATH) IMPLANT
SHEATH PINNACLE 8F 10CM (SHEATH) IMPLANT
SHEATH PINNACLE 9F 10CM (SHEATH) IMPLANT
SHEATH PROBE COVER 6X72 (BAG) IMPLANT
TUBING SMART ABLATE COOLFLOW (TUBING) IMPLANT

## 2022-07-28 NOTE — H&P (Signed)
Electrophysiology Office Note:    Date:  07/28/2022   ID:  Rachael, Carr 1940/08/10, MRN [id number]  PCP:  Wenda Low, [place] Providers Cardiologist:  None Electrophysiologist:  Melida Quitter, MD     Referring MD: No ref. provider found   History of Present Illness:    Christine Hall is a 82 y.o. female with a hx listed below, significant for HTN and frequent PVCs, referred for arrhythmia management.  Per referral records, she was very active and functional until October or November of 2022, at which time she was diagnosed with frequent PVCs. She was treated with diuretics.  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  Cardiac MRI showed normal structure and function.  She has tried many medications but has not been able to tolerate them due to various intolerances. She most recently tried flecainide but felt worse taking it.  She presents today for PVC ablation. She has had no significant changes in diagnoses or medications since our last clinic visit.  Past Medical History:  Diagnosis Date   Basal cell carcinoma 09/11/2008   left inner eye tx mohs   BCC (basal cell carcinoma of skin) 10/18/2013   left cheek tx mohs   Hypertension     Past Surgical History:  Procedure Laterality Date   LAPAROSCOPIC CHOLECYSTECTOMY  2002   macular pucker Left 2012   RIGHT/LEFT HEART CATH AND CORONARY ANGIOGRAPHY N/A 05/26/2021   Procedure: RIGHT/LEFT HEART CATH AND CORONARY ANGIOGRAPHY;  Surgeon: Adrian Prows, MD;  Location: [facility];  Service: Cardiovascular;  Laterality: N/A;   TOTAL ABDOMINAL HYSTERECTOMY  1982    Current Medications: Current Meds  Medication Sig   Cholecalciferol (VITAMIN D) 50 MCG (2000 UT) CAPS Take 2,000 Units by mouth daily.   hydrochlorothiazide (HYDRODIURIL) 12.5 MG tablet Take 1 tablet (12.5 mg total) by mouth daily.    lisinopril (ZESTRIL) 20 MG tablet Take 20 mg by mouth daily.     Allergies:   Atenolol, Diltiazem, Verapamil, Acebutolol, Advair hfa [fluticasone-salmeterol], Bystolic [nebivolol hcl], Codeine, Flecainide, Hydralazine hcl, Lisinopril, Metoprolol, Olmesartan, Olmesartan medoxomil-hctz, Other, Plendil [felodipine], Sildenafil citrate, Bacitracin-polymyxin b, Iodine, Latex, Neosporin [neomycin-bacitracin zn-polymyx], and Tape   Social History   Socioeconomic History   Marital status: Married    Spouse name: Not on file   Number of children: 0   Years of education: Not on file   Highest education level: Not on file  Occupational History   Not on file  Tobacco Use   Smoking status: Never   Smokeless tobacco: Never  Vaping Use   Vaping Use: Never used  Substance and Sexual Activity   Alcohol use: No   Drug use: No   Sexual activity: Not on file  Other Topics Concern   Not on file  Social History Narrative   2 adopted children   Social Determinants of Health   Financial Resource Strain: Not on file  Food Insecurity: Not on file  Transportation Needs: Not on file  Physical Activity: Not on file  Stress: Not on file  Social Connections: Not on file     Family History: The patient's family history includes Atrial fibrillation in her sister; Colon cancer (age of onset: 36) in her father; Heart attack in her brother, father, and mother.  ROS:   Please see the history of present illness.    All other systems reviewed and are negative.  EKGs/Labs/Other Studies Reviewed Today:     Cardiac MR 10/01/2021: normal structure and function  EKG:  Last EKG results: today. Sinus rhythm with PVCs  I reviewed multiple ECGs in EPIC. All show frequent PVCs, occasionally with bigeminy. PVCs are monomorphic, with precordial transition in V4, ++II, +aVF, iso III. PVC QRS is fractionated, and MDI is fairly late in several leads.  Recent Labs: 07/21/2022: BUN 32; Creatinine, Ser 1.48; Hemoglobin  12.6; Platelets 171; Potassium 4.2; Sodium 142     Physical Exam:    VS:  BP (!) 178/60   Pulse (!) 53   Temp (!) 97.3 F (36.3 C) (Temporal)   Resp 17   Ht 5\' 4"  (1.626 m)   Wt 64.9 kg   SpO2 100%   BMI 24.55 kg/m     Wt Readings from Last 3 Encounters:  07/28/22 64.9 kg  07/01/22 67.3 kg  04/20/22 66.2 kg     GEN: Well nourished, well developed in no acute distress CARDIAC: Irregular rhythm, no murmurs, rubs, gallops RESPIRATORY:  Normal work of breathing MUSCULOSKELETAL: no edema    ASSESSMENT & PLAN:    Frequent unifocal PVCs: Possibly right-sided, inferior origin v left septal.  She did not tolerate flecainide, other medications including some betablockers and diltiazem. We discussed the indication, rationale, logistics, anticipated benefits, and potential risks of the ablation procedure including but not limited to -- bleed at the groin access site, chest pain, damage to nearby organs such as the diaphragm, lungs, or esophagus, need for a drainage tube, or prolonged hospitalization. I explained that the risk for stroke, heart attack, need for open chest surgery, or even death is very low but not zero.   Chronic CHF with preserved EF. I suspect PVCs are contributing Hypertension:  on HCTZ  At this point, she is contemplating ablation but understandably has reservations. At the minimum, she will need regular TTEs to monitor LV function. Otherwise, I will be happy to arrange ablation if she decides to proceed.       Medication Adjustments/Labs and Tests Ordered: Current medicines are reviewed at length with the patient today.  Concerns regarding medicines are outlined above.  Orders Placed This Encounter  Procedures   Informed Consent Details: Physician/Practitioner Attestation; Transcribe to consent form and obtain patient signature   Initiate Pre-op Protocol   Void on call to EP Lab   Confirm CBC and BMP (or CMP) results within 7 days for inpatient and 30 days  for outpatient:   Clip right and left femoral area PM before surgery   Clip right internal jugular area PM before surgery   Pre-admission testing diagnosis   EP STUDY   Insert peripheral IV   Meds ordered this encounter  Medications   0.9 %  sodium chloride infusion     Signed, Melida Quitter, MD  07/28/2022 11:03 AM    [place]

## 2022-07-28 NOTE — Anesthesia Procedure Notes (Signed)
Procedure Name: MAC Date/Time: 07/28/2022 11:30 AM  Performed by: Trinna Post., CRNAPre-anesthesia Checklist: Patient identified, Emergency Drugs available, Suction available, Patient being monitored and Timeout performed Patient Re-evaluated:Patient Re-evaluated prior to induction Oxygen Delivery Method: Simple face mask Preoxygenation: Pre-oxygenation with 100% oxygen Induction Type: IV induction Placement Confirmation: positive ETCO2

## 2022-07-28 NOTE — Discharge Instructions (Addendum)
Post procedure care instructions No driving for 4 days. No lifting over 5 lbs for 1 week. No vigorous or sexual activity for 1 week. You may return to work/your usual activities on 08/05/22. Keep procedure site clean & dry. If you notice increased pain, swelling, bleeding or pus, call/return!  You may shower after 24 hours, but no soaking in baths/hot tubs/pools for 1 week.  Cardiac Ablation, Care After  This sheet gives you information about how to care for yourself after your procedure. Your health care provider may also give you more specific instructions. If you have problems or questions, contact your health care provider. What can I expect after the procedure? After the procedure, it is common to have: Bruising around your puncture site. Tenderness around your puncture site. Skipped heartbeats. If you had an atrial fibrillation ablation, you may have atrial fibrillation during the first several months after your procedure.  Tiredness (fatigue).  Follow these instructions at home: Puncture site care  Follow instructions from your health care provider about how to take care of your puncture site. Make sure you: If present, leave stitches (sutures), skin glue, or adhesive strips in place. These skin closures may need to stay in place for up to 2 weeks. If adhesive strip edges start to loosen and curl up, you may trim the loose edges. Do not remove adhesive strips completely unless your health care provider tells you to do that. If a large square bandage is present, this may be removed 24 hours after surgery.  Check your puncture site every day for signs of infection. Check for: Redness, swelling, or pain. Fluid or blood. If your puncture site starts to bleed, lie down on your back, apply firm pressure to the area, and contact your health care provider. Warmth. Pus or a bad smell. A pea or small marble sized lump at the site is normal and can take up to three months to resolve.  Driving Do  not drive for at least 4 days after your procedure or however long your health care provider recommends. (Do not resume driving if you have previously been instructed not to drive for other health reasons.) Do not drive or use heavy machinery while taking prescription pain medicine. Activity Avoid activities that take a lot of effort for at least 7 days after your procedure. Do not lift anything that is heavier than 5 lb (4.5 kg) for one week.  No sexual activity for 1 week.  Return to your normal activities as told by your health care provider. Ask your health care provider what activities are safe for you. General instructions Take over-the-counter and prescription medicines only as told by your health care provider. Do not use any products that contain nicotine or tobacco, such as cigarettes and e-cigarettes. If you need help quitting, ask your health care provider. You may shower after 24 hours, but Do not take baths, swim, or use a hot tub for 1 week.  Do not drink alcohol for 24 hours after your procedure. Keep all follow-up visits as told by your health care provider. This is important. Contact a health care provider if: You have redness, mild swelling, or pain around your puncture site. You have fluid or blood coming from your puncture site that stops after applying firm pressure to the area. Your puncture site feels warm to the touch. You have pus or a bad smell coming from your puncture site. You have a fever. You have chest pain or discomfort that spreads to your  neck, jaw, or arm. You have chest pain that is worse with lying on your back or taking a deep breath. You are sweating a lot. You feel nauseous. You have a fast or irregular heartbeat. You have shortness of breath. You are dizzy or light-headed and feel the need to lie down. You have pain or numbness in the arm or leg closest to your puncture site. Get help right away if: Your puncture site suddenly swells. Your  puncture site is bleeding and the bleeding does not stop after applying firm pressure to the area. These symptoms may represent a serious problem that is an emergency. Do not wait to see if the symptoms will go away. Get medical help right away. Call your local emergency services (911 in the [place] Do not drive yourself to the hospital. Summary After the procedure, it is normal to have bruising and tenderness at the puncture site in your groin, neck, or forearm. Check your puncture site every day for signs of infection. Get help right away if your puncture site is bleeding and the bleeding does not stop after applying firm pressure to the area. This is a medical emergency. This information is not intended to replace advice given to you by your health care provider. Make sure you discuss any questions you have with your health care provider.

## 2022-07-28 NOTE — Transfer of Care (Signed)
Immediate Anesthesia Transfer of Care Note  Patient: Christine Hall  Procedure(s) Performed: PVC ABLATION  Patient Location: PACU  Anesthesia Type:MAC  Level of Consciousness: awake, alert , and oriented  Airway & Oxygen Therapy: Patient Spontanous Breathing  Post-op Assessment: Report given to RN and Post -op Vital signs reviewed and stable  Post vital signs: Reviewed and stable  Last Vitals:  Vitals Value Taken Time  BP 126/52 07/28/22 1445  Temp 36.8 C 07/28/22 1446  Pulse 38 07/28/22 1447  Resp 20 07/28/22 1447  SpO2 96 % 07/28/22 1447  Vitals shown include unvalidated device data.  Last Pain:  Vitals:   07/28/22 1446  TempSrc: Temporal  PainSc:       Patients Stated Pain Goal: 5 ([id number])  Complications: No notable events documented.

## 2022-07-28 NOTE — Anesthesia Preprocedure Evaluation (Signed)
Anesthesia Evaluation  Patient identified by MRN, date of birth, ID band Patient awake    Reviewed: Allergy & Precautions, NPO status , Patient's Chart, lab work & pertinent test results  History of Anesthesia Complications Negative for: history of anesthetic complications  Airway Mallampati: III  TM Distance: >3 FB Neck ROM: Full  Mouth opening: Limited Mouth Opening  Dental  (+) Teeth Intact, Dental Advisory Given   Pulmonary shortness of breath, neg sleep apnea, neg COPD, neg recent URI   breath sounds clear to auscultation       Cardiovascular hypertension, Pt. on medications (-) angina +CHF  (-) Past MI + dysrhythmias  Rhythm:Regular     Neuro/Psych negative neurological ROS  negative psych ROS   GI/Hepatic negative GI ROS, Neg liver ROS,,,  Endo/Other    Renal/GU CRFRenal diseaseLab Results      Component                Value               Date                      CREATININE               1.48 (H)            07/21/2022                Musculoskeletal   Abdominal   Peds  Hematology negative hematology ROS (+) Lab Results      Component                Value               Date                      WBC                      6.4                 07/21/2022                HGB                      12.6                07/21/2022                HCT                      38.3                07/21/2022                MCV                      91                  07/21/2022                PLT                      171                 07/21/2022              Anesthesia Other Findings   Reproductive/Obstetrics  Anesthesia Physical Anesthesia Plan  ASA: 3  Anesthesia Plan: MAC   Post-op Pain Management: Minimal or no pain anticipated   Induction: Intravenous  PONV Risk Score and Plan: 2 and Treatment may vary due to age or medical condition  Airway Management  Planned: Nasal Cannula, Natural Airway and Simple Face Mask  Additional Equipment: None  Intra-op Plan:   Post-operative Plan:   Informed Consent: I have reviewed the patients History and Physical, chart, labs and discussed the procedure including the risks, benefits and alternatives for the proposed anesthesia with the patient or authorized representative who has indicated his/her understanding and acceptance.     Dental advisory given  Plan Discussed with: CRNA  Anesthesia Plan Comments:        Anesthesia Quick Evaluation

## 2022-07-28 NOTE — Progress Notes (Signed)
Pt ambulated to and from bathroom to void with no signs of oozing from bilateral sites

## 2022-07-29 ENCOUNTER — Encounter (HOSPITAL_COMMUNITY): Payer: Self-pay | Admitting: Cardiovascular Disease

## 2022-07-29 LAB — POCT ACTIVATED CLOTTING TIME
Activated Clotting Time: 277 seconds
Activated Clotting Time: 287 seconds

## 2022-07-29 NOTE — Anesthesia Postprocedure Evaluation (Signed)
Anesthesia Post Note  Patient: Christine Hall  Procedure(s) Performed: PVC ABLATION     Patient location during evaluation: Cath Lab Anesthesia Type: MAC Level of consciousness: awake and alert Pain management: pain level controlled Vital Signs Assessment: post-procedure vital signs reviewed and stable Respiratory status: spontaneous breathing, nonlabored ventilation and respiratory function stable Cardiovascular status: stable and blood pressure returned to baseline Postop Assessment: no apparent nausea or vomiting Anesthetic complications: no  There were no known notable events for this encounter.  Last Vitals:  Vitals:   07/28/22 1732 07/28/22 1800  BP: (!) 111/40 (!) 119/44  Pulse: 79 81  Resp: 19 (!) 21  Temp:    SpO2: 97% 96%    Last Pain:  Vitals:   07/28/22 1534  TempSrc:   PainSc: 0-No pain                 Codey Burling

## 2022-08-04 ENCOUNTER — Encounter: Payer: Self-pay | Admitting: Cardiovascular Disease

## 2022-08-04 ENCOUNTER — Telehealth: Payer: Self-pay | Admitting: Cardiovascular Disease

## 2022-08-04 NOTE — Telephone Encounter (Signed)
Spoke with patient who reports onset of oozing at right groin ablation site. She states it started this afternoon and it is a clear pinkish drainage. She denies bright red blood, yellow drainage or any odor. She applied a nonstick bandage.   Notified Dr. Myles Gip and he advised for patient to continue to monitor.   Advised patient of Dr Karel Jarvis instruction, keep area clean and dry and notify us of any worsening symptoms. Education provided on monitoring for infection, hematoma and other complications. Patient verbalized understanding and had no questions.

## 2022-08-04 NOTE — Telephone Encounter (Signed)
Patient is calling stating that the incision of her ablation on the right side started oozing today right before calling our office. Patient sent a photo of the incision while on the call. Please advise.

## 2022-08-25 NOTE — Progress Notes (Unsigned)
Electrophysiology Office Note:    Date:  08/26/2022   ID:  Christine Hall, DOB Jan 01, 1941, MRN [id number]  PCP:  Georgann Housekeeper, MD   [facility] Providers Cardiologist:  None Electrophysiologist:  Maurice Small, MD     Referring MD: Georgann Housekeeper, MD   History of Present Illness:    Christine Hall is a 82 y.o. female with a hx listed below, significant for HTN and frequent PVCs, referred for arrhythmia management.  Per referral records, she was very active and functional until October or November of 2022, at which time she was diagnosed with frequent PVCs. She was treated with diuretics.  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  Cardiac MRI showed normal structure and function.  She has tried many medications but has not been able to tolerate them due to various intolerances. She most recently tried flecainide but felt worse taking it.  She underwent an attempted PVC ablation on July 28, 2022. I was able to suppress the PVCs with ablation at the inferior RV septum adjacent to the tricuspid valve. She continues to have occasional palpitations, fatigue. Not improved from the ablation.  Past Medical History:  Diagnosis Date   Basal cell carcinoma 09/11/2008   left inner eye tx mohs   BCC (basal cell carcinoma of skin) 10/18/2013   left cheek tx mohs   Hypertension     Past Surgical History:  Procedure Laterality Date   LAPAROSCOPIC CHOLECYSTECTOMY  2002   macular pucker Left 2012   PVC ABLATION N/A 07/28/2022   Procedure: PVC ABLATION;  Surgeon: Maurice Small, MD;  Location: [facility];  Service: Cardiovascular;  Laterality: N/A;   RIGHT/LEFT HEART CATH AND CORONARY ANGIOGRAPHY N/A 05/26/2021   Procedure: RIGHT/LEFT HEART CATH AND CORONARY ANGIOGRAPHY;  Surgeon: Yates Decamp, MD;  Location: [facility];  Service: Cardiovascular;  Laterality:  N/A;   TOTAL ABDOMINAL HYSTERECTOMY  1982    Current Medications: Current Meds  Medication Sig   Cholecalciferol (VITAMIN D) 50 MCG (2000 UT) CAPS Take 2,000 Units by mouth daily.   hydrochlorothiazide (HYDRODIURIL) 12.5 MG tablet Take 1 tablet (12.5 mg total) by mouth daily.   lisinopril (ZESTRIL) 20 MG tablet Take 20 mg by mouth daily.     Allergies:   Atenolol, Diltiazem, Verapamil, Acebutolol, Advair hfa [fluticasone-salmeterol], Bystolic [nebivolol hcl], Codeine, Flecainide, Hydralazine hcl, Lisinopril, Metoprolol, Olmesartan, Olmesartan medoxomil-hctz, Other, Plendil [felodipine], Sildenafil citrate, Bacitracin-polymyxin b, Iodine, Latex, Neosporin [neomycin-bacitracin zn-polymyx], and Tape   Social History   Socioeconomic History   Marital status: Married    Spouse name: Not on file   Number of children: 0   Years of education: Not on file   Highest education level: Not on file  Occupational History   Not on file  Tobacco Use   Smoking status: Never   Smokeless tobacco: Never  Vaping Use   Vaping Use: Never used  Substance and Sexual Activity   Alcohol use: No   Drug use: No   Sexual activity: Not on file  Other Topics Concern   Not on file  Social History Narrative   2 adopted children   Social Determinants of Health   Financial Resource Strain: Not on file  Food Insecurity: Not on file  Transportation Needs: Not on file  Physical Activity: Not on file  Stress: Not on file  Social Connections: Not on file     Family History: The  patient's family history includes Atrial fibrillation in her sister; Colon cancer (age of onset: 37) in her father; Heart attack in her brother, father, and mother.  ROS:   Please see the history of present illness.    All other systems reviewed and are negative.  EKGs/Labs/Other Studies Reviewed Today:     Cardiac MR 10/01/2021: normal structure and function  EKG:  Last EKG results: today. Sinus rhythm with PVCs in  bigeminy  ECGs in epic show frequent PVCs, occasionally with bigeminy. PVCs are monomorphic, with precordial transition in V4, ++II, +aVF, iso III. PVC QRS is fractionated, and MDI is fairly late in several leads.  Recent Labs: 07/21/2022: BUN 32; Creatinine, Ser 1.48; Hemoglobin 12.6; Platelets 171; Potassium 4.2; Sodium 142     Physical Exam:    VS:  BP (!) 152/70 (BP Location: Left Arm, Patient Position: Sitting, Cuff Size: Normal)   Pulse (!) 41   Ht  (1.626 m)   Wt 148 lb 3.2 oz (67.2 kg)   SpO2 97%   BMI 25.44 kg/m     Wt Readings from Last 3 Encounters:  08/26/22 148 lb 3.2 oz (67.2 kg)  07/28/22 143 lb (64.9 kg)  07/01/22 148 lb 6.4 oz (67.3 kg)     GEN: Well nourished, well developed in no acute distress CARDIAC: Irregular rhythm, no murmurs, rubs, gallops RESPIRATORY:  Normal work of breathing MUSCULOSKELETAL: no edema    ASSESSMENT & PLAN:    Frequent unifocal PVCs:   Status-post unsuccessful PVC ablation. PVC was arising from deep within the septum, unable to effect with ablation at high output She continues to have a high burden of PVCs EF is normal She does not tolerate medications well Will repeat monitor to assess burden   Chronic CHF with preserved EF.  I suspect PVCs are contributing I reassured her that her heart function and strength is normal  Hypertension:   on HCTZ         Medication Adjustments/Labs and Tests Ordered: Current medicines are reviewed at length with the patient today.  Concerns regarding medicines are outlined above.  Orders Placed This Encounter  Procedures   LONG TERM MONITOR (3-14 DAYS)   EKG 12-Lead   No orders of the defined types were placed in this encounter.    Signed, Maurice Small, MD  08/26/2022 1:08 PM    [facility]

## 2022-08-26 ENCOUNTER — Ambulatory Visit: Payer: Medicare Other | Attending: Cardiovascular Disease

## 2022-08-26 ENCOUNTER — Encounter: Payer: Self-pay | Admitting: Cardiovascular Disease

## 2022-08-26 ENCOUNTER — Ambulatory Visit: Payer: Medicare Other | Attending: Cardiovascular Disease | Admitting: Cardiovascular Disease

## 2022-08-26 VITALS — BP 152/70 | HR 41 | Ht 64.0 in | Wt 148.2 lb

## 2022-08-26 DIAGNOSIS — I493 Ventricular premature depolarization: Secondary | ICD-10-CM | POA: Insufficient documentation

## 2022-08-26 NOTE — Patient Instructions (Signed)
Medication Instructions:  Your physician recommends that you continue on your current medications as directed. Please refer to the Current Medication list given to you today. *If you need a refill on your cardiac medications before your next appointment, please call your pharmacy*   Testing/Procedures: Zio Cardiac Monitor  Your physician has recommended that you wear an event monitor. Event monitors are medical devices that record the heart's electrical activity. Doctors most often [place] these monitors to diagnose arrhythmias. Arrhythmias are problems with the speed or rhythm of the heartbeat. The monitor is a small, portable device. You can wear one while you do your normal daily activities. This is usually used to diagnose what is causing palpitations/syncope (passing out).   Follow-Up: At [facility], you and your health needs are our priority.  As part of our continuing mission to provide you with exceptional heart care, we have created designated Provider Care Teams.  These Care Teams include your primary Cardiologist (physician) and Advanced Practice Providers (APPs -  Physician Assistants and Nurse Practitioners) who all work together to provide you with the care you need, when you need it.  We recommend signing up for the patient portal called "MyChart".  Sign up information is provided on this After Visit Summary.  MyChart is used to connect with patients for Virtual Visits (Telemedicine).  Patients are able to view lab/test results, encounter notes, upcoming appointments, etc.  Non-urgent messages can be sent to your provider as well.   To learn more about what you can do with MyChart, go to [email or web address].    Your next appointment:   1-2 months   Provider:   York Pellant, MD    Christena Deem- Long Term Monitor Instructions  Your physician has requested you wear a ZIO patch monitor for 14 days.  This is a single patch monitor. [employer] supplies one patch monitor per  enrollment. Additional stickers are not available. Please do not apply patch if you will be having a Nuclear Stress Test,  Echocardiogram, Cardiac CT, MRI, or Chest Xray during the period you would be wearing the  monitor. The patch cannot be worn during these tests. You cannot remove and re-apply the  ZIO XT patch monitor.  Your ZIO patch monitor will be mailed 3 day USPS to your address on file. It may take 3-5 days  to receive your monitor after you have been enrolled.  Once you have received your monitor, please review the enclosed instructions. Your monitor  has already been registered assigning a specific monitor serial # to you.  Billing and Patient Assistance Program Information  We have supplied [employer] with any of your insurance information on file for billing purposes. [employer] offers a sliding scale Patient Assistance Program for patients that do not have  insurance, or whose insurance does not completely cover the cost of the ZIO monitor.  You must apply for the Patient Assistance Program to qualify for this discounted rate.  To apply, please call [employer] at [phone], select option 4, select option 2, ask to apply for  Patient Assistance Program. Meredeth Ide will ask your household income, and how many people  are in your household. They will quote your out-of-pocket cost based on that information.  [employer] will also be able to set up a 50-month, interest-free payment plan if needed.  Applying the monitor   Shave hair from upper left chest.  Hold abrader disc by orange tab. Rub abrader in 40 strokes over the upper left chest as  indicated in  your monitor instructions.  Clean area with 4 enclosed alcohol pads. Let dry.  Apply patch as indicated in monitor instructions. Patch will be placed under collarbone on left  side of chest with arrow pointing upward.  Rub patch adhesive wings for 2 minutes. Remove white label marked "1". Remove the white  label marked "2". Rub patch  adhesive wings for 2 additional minutes.  While looking in a mirror, press and release button in center of patch. A small green light will  flash 3-4 times. This will be your only indicator that the monitor has been turned on.  Do not shower for the first 24 hours. You may shower after the first 24 hours.  Press the button if you feel a symptom. You will hear a small click. Record Date, Time and  Symptom in the Patient Logbook.  When you are ready to remove the patch, follow instructions on the last 2 pages of Patient  Logbook. Stick patch monitor onto the last page of Patient Logbook.  Place Patient Logbook in the blue and white box. Use locking tab on box and tape box closed  securely. The blue and white box has prepaid postage on it. Please place it in the mailbox as  soon as possible. Your physician should have your test results approximately 7 days after the  monitor has been mailed back to [facility].  Call [facility] Customer Care at [phone] if you have questions regarding  your ZIO XT patch monitor. Call them immediately if you see an orange light blinking on your  monitor.  If your monitor falls off in less than 4 days, contact our Monitor department at [phone].  If your monitor becomes loose or falls off after 4 days call [employer] at [phone] for  suggestions on securing your monitor

## 2022-08-26 NOTE — Progress Notes (Unsigned)
Enrolled for [employer] to mail a ZIO XT long term holter monitor to the patients address on file.  

## 2022-08-28 DIAGNOSIS — I493 Ventricular premature depolarization: Secondary | ICD-10-CM

## 2022-09-07 ENCOUNTER — Ambulatory Visit
Admission: RE | Admit: 2022-09-07 | Discharge: 2022-09-07 | Disposition: A | Discharge status: Home / Self Care | Payer: Medicare Other | Source: Ambulatory Visit | Attending: Internal Medicine | Admitting: Internal Medicine

## 2022-09-07 DIAGNOSIS — R921 Mammographic calcification found on diagnostic imaging of breast: Secondary | ICD-10-CM

## 2022-10-05 ENCOUNTER — Encounter: Payer: Self-pay | Admitting: Cardiovascular Disease

## 2022-10-05 ENCOUNTER — Ambulatory Visit: Payer: Medicare Other | Attending: Cardiovascular Disease | Admitting: Cardiovascular Disease

## 2022-10-05 VITALS — BP 148/88 | HR 74 | Ht 64.0 in | Wt 143.0 lb

## 2022-10-05 DIAGNOSIS — R9431 Abnormal electrocardiogram [ECG] [EKG]: Secondary | ICD-10-CM | POA: Insufficient documentation

## 2022-10-05 DIAGNOSIS — I493 Ventricular premature depolarization: Secondary | ICD-10-CM | POA: Diagnosis not present

## 2022-10-05 NOTE — Progress Notes (Signed)
Electrophysiology Office Note:    Date:  10/05/2022   ID:  Christine Hall, DOB Aug 27, 1940, MRN [id number]  PCP:  Georgann Housekeeper, MD    [facility] Providers Cardiologist:  None Electrophysiologist:  Maurice Small, MD     Referring MD: Georgann Housekeeper, MD   History of Present Illness:    Christine Hall is a 82 y.o. female with a hx listed below, significant for HTN and frequent PVCs, referred for arrhythmia management.  Per referral records, she was very active and functional until October or November of 2022, at which time she was diagnosed with frequent PVCs. She was treated with diuretics.  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  Cardiac MRI showed normal structure and function.  She has tried many medications but has not been able to tolerate them due to various intolerances. She most recently tried flecainide but felt worse taking it.  She underwent an attempted PVC ablation on July 28, 2022. I was able to suppress the PVCs with ablation at the inferior RV septum adjacent to the tricuspid valve. She continues to have occasional palpitations, fatigue. Not improved from the ablation.  Past Medical History:  Diagnosis Date   Basal cell carcinoma 09/11/2008   left inner eye tx mohs   BCC (basal cell carcinoma of skin) 10/18/2013   left cheek tx mohs   Hypertension     Past Surgical History:  Procedure Laterality Date   LAPAROSCOPIC CHOLECYSTECTOMY  2002   macular pucker Left 2012   PVC ABLATION N/A 07/28/2022   Procedure: PVC ABLATION;  Surgeon: Maurice Small, MD;  Location: [facility];  Service: Cardiovascular;  Laterality: N/A;   RIGHT/LEFT HEART CATH AND CORONARY ANGIOGRAPHY N/A 05/26/2021   Procedure: RIGHT/LEFT HEART CATH AND CORONARY ANGIOGRAPHY;  Surgeon: Yates Decamp, MD;  Location: [facility];  Service: Cardiovascular;  Laterality:  N/A;   TOTAL ABDOMINAL HYSTERECTOMY  1982    Current Medications: No outpatient medications have been marked as taking for the 10/05/22 encounter (Office Visit) with Gladies Sofranko, Roberts Gaudy, MD.     Allergies:   Atenolol, Diltiazem, Verapamil, Acebutolol, Advair hfa [fluticasone-salmeterol], Bystolic [nebivolol hcl], Codeine, Flecainide, Hydralazine hcl, Lisinopril, Metoprolol, Olmesartan, Olmesartan medoxomil-hctz, Other, Plendil [felodipine], Sildenafil citrate, Bacitracin-polymyxin b, Iodine, Latex, Neosporin [neomycin-bacitracin zn-polymyx], and Tape   Social History   Socioeconomic History   Marital status: Married    Spouse name: Not on file   Number of children: 0   Years of education: Not on file   Highest education level: Not on file  Occupational History   Not on file  Tobacco Use   Smoking status: Never   Smokeless tobacco: Never  Vaping Use   Vaping Use: Never used  Substance and Sexual Activity   Alcohol use: No   Drug use: No   Sexual activity: Not on file  Other Topics Concern   Not on file  Social History Narrative   2 adopted children   Social Determinants of Health   Financial Resource Strain: Not on file  Food Insecurity: Not on file  Transportation Needs: Not on file  Physical Activity: Not on file  Stress: Not on file  Social Connections: Not on file     Family History: The patient's family history includes Atrial fibrillation in her sister; Breast cancer in her sister; Breast cancer (age of onset: 44) in her niece; Colon cancer (age of onset: 4) in her  father; Heart attack in her brother, father, and mother.  ROS:   Please see the history of present illness.    All other systems reviewed and are negative.  EKGs/Labs/Other Studies Reviewed Today:     Cardiac MR 10/01/2021: normal structure and function  EKG:  Last EKG results: today. Sinus rhythm with PVCs in bigeminy  ECGs in epic show frequent PVCs, occasionally with bigeminy. PVCs are  monomorphic, with precordial transition in V4, ++II, +aVF, iso III. PVC QRS is fractionated, and MDI is fairly late in several leads.  Monitor  Sinus rhythm HR 54-96, avg 69 38.2% PVCs, frequently occurring in bigeminy Symptoms correlated with PVCs    Recent Labs: 07/21/2022: BUN 32; Creatinine, Ser 1.48; Hemoglobin 12.6; Platelets 171; Potassium 4.2; Sodium 142     Physical Exam:    VS:  BP (!) 148/88   Pulse 74   Ht 5\' 4"  (1.626 m)   Wt 143 lb (64.9 kg)   SpO2 97%   BMI 24.55 kg/m     Wt Readings from Last 3 Encounters:  10/05/22 143 lb (64.9 kg)  08/26/22 148 lb 3.2 oz (67.2 kg)  07/28/22 143 lb (64.9 kg)     GEN: Well nourished, well developed in no acute distress CARDIAC: Irregular rhythm, no murmurs, rubs, gallops RESPIRATORY:  Normal work of breathing MUSCULOSKELETAL: no edema    ASSESSMENT & PLAN:    Frequent unifocal PVCs:   Status-post unsuccessful PVC ablation. PVC was arising from deep within the septum, unable to effect with ablation at high output She continues to have a high burden of PVCs EF is normal - will recheck limited TTE every 6 months She does not tolerate medications well - has a history of tongue swelling with amiodarone. I'll check with pharmacy to see if there is a potential cross-reaction with amiodarone. Try magnesium taurate   Chronic CHF with preserved EF.  I suspect PVCs are contributing I reassured her that her heart function and strength is normal  Hypertension:   on HCTZ         Medication Adjustments/Labs and Tests Ordered: Current medicines are reviewed at length with the patient today.  Concerns regarding medicines are outlined above.  No orders of the defined types were placed in this encounter.  No orders of the defined types were placed in this encounter.    Signed, Maurice Small, MD  10/05/2022 3:26 PM    [facility]

## 2022-10-05 NOTE — Patient Instructions (Signed)
Medication Instructions:  START Magnesium Taurate 400mg  once daily  *If you need a refill on your cardiac medications before your next appointment, please call your pharmacy*   Testing/Procedures: Echocardiogram Your physician has requested that you have an echocardiogram. Echocardiography is a painless test that uses sound waves to create images of your heart. It provides your doctor with information about the size and shape of your heart and how well your heart's chambers and valves are working. This procedure takes approximately one hour. There are no restrictions for this procedure. Please do NOT wear cologne, perfume, aftershave, or lotions (deodorant is allowed). Please arrive 15 minutes prior to your appointment time.   Follow-Up: At [facility], you and your health needs are our priority.  As part of our continuing mission to provide you with exceptional heart care, we have created designated Provider Care Teams.  These Care Teams include your primary Cardiologist (physician) and Advanced Practice Providers (APPs -  Physician Assistants and Nurse Practitioners) who all work together to provide you with the care you need, when you need it.  We recommend signing up for the patient portal called "MyChart".  Sign up information is provided on this After Visit Summary.  MyChart is used to connect with patients for Virtual Visits (Telemedicine).  Patients are able to view lab/test results, encounter notes, upcoming appointments, etc.  Non-urgent messages can be sent to your provider as well.   To learn more about what you can do with MyChart, go to [email or web address].    Your next appointment:   1 - 2 month(s) (after echo completed)  Provider:   York Pellant, MD

## 2022-10-06 ENCOUNTER — Other Ambulatory Visit: Payer: Self-pay

## 2022-10-06 MED ORDER — MAGNESIUM 400 MG PO TABS: ORAL_TABLET | ORAL | 3 refills | Status: AC

## 2022-10-06 MED ORDER — MAGNESIUM 400 MG PO TABS: 400.0000 mg | ORAL_TABLET | Freq: Every day | ORAL | 3 refills | Status: DC

## 2022-10-06 NOTE — Progress Notes (Signed)
Adding Magnesium Taurate 400mg  once daily to patient medication list

## 2022-10-11 ENCOUNTER — Ambulatory Visit (INDEPENDENT_AMBULATORY_CARE_PROVIDER_SITE_OTHER): Payer: Medicare Other

## 2022-10-11 DIAGNOSIS — R918 Other nonspecific abnormal finding of lung field: Secondary | ICD-10-CM

## 2022-10-18 ENCOUNTER — Other Ambulatory Visit: Payer: Self-pay | Admitting: *Deleted

## 2022-10-18 DIAGNOSIS — R918 Other nonspecific abnormal finding of lung field: Secondary | ICD-10-CM

## 2022-10-18 NOTE — Progress Notes (Signed)
Order placed for f/u CT chest in 1 year.  Nothing further needed.

## 2022-10-20 ENCOUNTER — Other Ambulatory Visit: Payer: Self-pay | Admitting: Cardiology

## 2022-10-20 DIAGNOSIS — I10 Essential (primary) hypertension: Secondary | ICD-10-CM

## 2022-10-20 DIAGNOSIS — I5032 Chronic diastolic (congestive) heart failure: Secondary | ICD-10-CM

## 2022-11-04 ENCOUNTER — Other Ambulatory Visit: Payer: Self-pay | Admitting: Internal Medicine

## 2022-11-04 DIAGNOSIS — K869 Disease of pancreas, unspecified: Secondary | ICD-10-CM

## 2022-11-08 ENCOUNTER — Ambulatory Visit (HOSPITAL_COMMUNITY): Payer: Medicare Other | Attending: Cardiovascular Disease

## 2022-11-08 DIAGNOSIS — R9431 Abnormal electrocardiogram [ECG] [EKG]: Secondary | ICD-10-CM | POA: Insufficient documentation

## 2022-11-08 LAB — ECHOCARDIOGRAM LIMITED
Area-P 1/2: 4.12 cm2
P 1/2 time: 531 msec
S' Lateral: 2.8 cm

## 2022-11-25 ENCOUNTER — Telehealth: Payer: Self-pay

## 2022-11-25 MED ORDER — DIPHENHYDRAMINE HCL 50 MG PO TABS: 50.0000 mg | ORAL_TABLET | Freq: Once | ORAL | 0 refills | Status: DC

## 2022-11-25 MED ORDER — PREDNISONE 50 MG PO TABS: ORAL_TABLET | ORAL | 0 refills | Status: DC

## 2022-11-25 NOTE — Telephone Encounter (Addendum)
Phone call to patient to review instructions for 13 hr prep for CT w/ contrast on 11/29/22 at 10:50 AM. Prescription called into [facility]. Pt aware and verbalized understanding of instructions. Prescription: Pt to take 50 mg of prednisone on 11/28/22 at 9:50 PM, 50 mg of prednisone on 11/29/22 at 3:50 AM, and 50 mg of prednisone on 11/29/22 at 9:50 AM. Pt is also to take 50 mg of benadryl on 11/29/22 at 9:50 AM. Please call [phone] with any questions.   Benadryl was also called in as a prescription at the patients request. Pt advised not to drive the day of taking this medication. Pt verbalized understanding.

## 2022-11-29 ENCOUNTER — Ambulatory Visit
Admission: RE | Admit: 2022-11-29 | Discharge: 2022-11-29 | Disposition: A | Discharge status: Home / Self Care | Payer: Medicare Other | Source: Ambulatory Visit | Attending: Internal Medicine | Admitting: Internal Medicine

## 2022-11-29 DIAGNOSIS — K869 Disease of pancreas, unspecified: Secondary | ICD-10-CM

## 2022-11-29 MED ORDER — IOPAMIDOL (ISOVUE-300) INJECTION 61%
80.0000 mL | Freq: Once | INTRAVENOUS | Status: AC | PRN
  Administered 2022-11-29: 80 mL via INTRAVENOUS

## 2022-12-14 ENCOUNTER — Ambulatory Visit: Payer: Medicare Other | Admitting: Cardiovascular Disease

## 2022-12-22 NOTE — Telephone Encounter (Signed)
No action done

## 2022-12-27 ENCOUNTER — Ambulatory Visit: Payer: BLUE CROSS/BLUE SHIELD | Admitting: Cardiovascular Disease

## 2022-12-30 ENCOUNTER — Ambulatory Visit: Payer: Medicare Other | Admitting: Cardiology

## 2023-01-27 ENCOUNTER — Encounter: Payer: Self-pay | Admitting: Cardiovascular Disease

## 2023-01-27 ENCOUNTER — Ambulatory Visit: Payer: Medicare Other | Attending: Cardiovascular Disease | Admitting: Cardiovascular Disease

## 2023-01-27 VITALS — BP 170/68 | HR 81 | Ht 64.0 in | Wt 144.6 lb

## 2023-01-27 DIAGNOSIS — I493 Ventricular premature depolarization: Secondary | ICD-10-CM | POA: Insufficient documentation

## 2023-01-27 NOTE — Patient Instructions (Signed)
Medication Instructions:  Start Magnesium Taurate 400 mg once daily *If you need a refill on your cardiac medications before your next appointment, please call your pharmacy*   Follow-Up: At [facility], you and your health needs are our priority.  As part of our continuing mission to provide you with exceptional heart care, we have created designated Provider Care Teams.  These Care Teams include your primary Cardiologist (physician) and Advanced Practice Providers (APPs -  Physician Assistants and Nurse Practitioners) who all work together to provide you with the care you need, when you need it.  We recommend signing up for the patient portal called "MyChart".  Sign up information is provided on this After Visit Summary.  MyChart is used to connect with patients for Virtual Visits (Telemedicine).  Patients are able to view lab/test results, encounter notes, upcoming appointments, etc.  Non-urgent messages can be sent to your provider as well.   To learn more about what you can do with MyChart, go to [email or web address].    Your next appointment:   6 month(s)  Provider:   York Pellant, MD

## 2023-01-27 NOTE — Progress Notes (Signed)
Electrophysiology Office Note:    Date:  01/27/2023   ID:  GUYNELL ELDEN, DOB Dec 24, 1940, MRN [id number]  PCP:  Georgann Housekeeper, MD   [facility] Providers Cardiologist:  None Electrophysiologist:  Maurice Small, MD     Referring MD: Georgann Housekeeper, MD   History of Present Illness:    Christine Hall is a 82 y.o. female with a hx listed below, significant for HTN and frequent PVCs, referred for arrhythmia management.  Per referral records, she was very active and functional until October or November of 2022, at which time she was diagnosed with frequent PVCs. She was treated with diuretics.  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  Cardiac MRI showed normal structure and function.  She has tried many medications but has not been able to tolerate them due to various intolerances. She most recently tried flecainide but felt worse taking it.  She underwent an attempted PVC ablation on July 28, 2022. I was able to suppress transiently the PVCs with ablation at the inferior RV septum adjacent to the tricuspid valve. She continues to have occasional palpitations, fatigue. Not improved from the ablation.  Today, she reports she has at baseline.  She continues to have intermittent palpitations.   EKGs/Labs/Other Studies Reviewed Today:     TTE 11/08/2022 EF 60-65%.  Mild mitral regurgitation.  Cardiac MR 10/01/2021: normal structure and function  EKG:  Last EKG results: today. Sinus rhythm with PVCs in bigeminy  ECGs in epic show frequent PVCs, occasionally with bigeminy. PVCs are monomorphic, with precordial transition in V4, ++II, +aVF, iso III. PVC QRS is fractionated, and MDI is fairly late in several leads.  Monitor  Sinus rhythm HR 54-96, avg 69 38.2% PVCs, frequently occurring in bigeminy Symptoms correlated with PVCs    Recent Labs: 07/21/2022: BUN 32;  Creatinine, Ser 1.48; Hemoglobin 12.6; Platelets 171; Potassium 4.2; Sodium 142     Physical Exam:    VS:  BP (!) 170/68   Pulse 81   Ht 5\' 4"  (1.626 m)   Wt 144 lb 9.6 oz (65.6 kg)   SpO2 99%   BMI 24.82 kg/m     Wt Readings from Last 3 Encounters:  01/27/23 144 lb 9.6 oz (65.6 kg)  10/05/22 143 lb (64.9 kg)  08/26/22 148 lb 3.2 oz (67.2 kg)     GEN: Well nourished, well developed in no acute distress CARDIAC: Irregular rhythm, no murmurs, rubs, gallops RESPIRATORY:  Normal work of breathing MUSCULOSKELETAL: no edema    ASSESSMENT & PLAN:    Frequent unifocal PVCs:   Status-post unsuccessful PVC ablation. PVC was arising from deep within the septum, unable to effect with ablation at high output She continues to have a high burden of PVCs EF remains normal She does not tolerate medications well - has a history of tongue swelling with amiodarone, flecainide, diltiazem, multiple beta-blockers.  Try magnesium taurate -- she has not gotten this yet.  I reassured her that both magnesium and tolerating --the active components of magnesium tolerate--are naturally occurring substances that are essential.  There is some possibility that she could have reaction to the nonactive ingredients of the medication.   Chronic CHF with preserved EF.  I suspect PVCs are contributing I reassured her that her heart function and strength is normal  Hypertension:   on HCTZ 12.5, lisinopril 20, HCTZ 12.5 She reports that BP control is much better at home Medical  therapy is limited due to medication intolerance         Medication Adjustments/Labs and Tests Ordered: Current medicines are reviewed at length with the patient today.  Concerns regarding medicines are outlined above.  Orders Placed This Encounter  Procedures   EKG 12-Lead   No orders of the defined types were placed in this encounter.    Signed, Maurice Small, MD  01/27/2023 10:31 AM    [facility]

## 2023-07-28 NOTE — Progress Notes (Unsigned)
  Electrophysiology Office Note:   Date:  07/29/2023  ID:  Ellean, Firman 1940-09-16, MRN [id number]  Primary Cardiologist: None Primary Heart Failure: None Electrophysiologist: Maurice Small, MD      History of Present Illness:   Christine Hall is a 83 y.o. female with h/o PVC's, HTN, dCHF, pulmonary nodules, chronic cough, reactive airway disease seen today for routine electrophysiology followup.   Since last being seen in our clinic the patient reports she has been doing ok overall. She states she still has PVC's but they are less noticeable in general.  She remains on magnesium taurate (multiple medication intolerances in the past).    She denies chest pain, palpitations, dyspnea, PND, orthopnea, nausea, vomiting, dizziness, syncope, edema, weight gain, or early satiety.   Review of systems complete and found to be negative unless listed in HPI.   Home BP trend    EP Information / Studies Reviewed:    EKG is not ordered today. EKG from 01/27/23 reviewed which showed SR with PVC's      Studies:  Cardiac MR 09/2021 > normal structure and function  EPS 07/28/22 > frequent PVC's suggesting an origin from the inferior septum, PVC's mapped to the right septum, adjacent to the posterior aspect of the tricuspid valve ECHO 11/2022 > LVEF 60-65%, mild mitral regurgitation   Arrhythmia / AAD PVC's >  ECGs in epic show frequent PVCs, occasionally with bigeminy. PVCs are monomorphic, with precordial transition in V4, ++II, +aVF, iso III. PVC QRS is fractionated, and MDI is fairly late in several leads  Amiodarone > tongue swelling  Flecainide > felt poorly  Diltiazem & multiple beta blockers      Physical Exam:   VS:  BP (!) 160/72   Pulse 78   Ht 5\' 4"  (1.626 m)   Wt 142 lb (64.4 kg)   SpO2 98%   BMI 24.37 kg/m    Wt Readings from Last 3 Encounters:  07/29/23 142 lb (64.4 kg)  01/27/23 144 lb 9.6 oz (65.6 kg)  10/05/22 143 lb (64.9 kg)     GEN: Well nourished, well  developed in no acute distress NECK: No JVD; No carotid bruits CARDIAC: Regular rate and rhythm, no murmurs, rubs, gallops RESPIRATORY:  Clear to auscultation without rales, wheezing or rhonchi  ABDOMEN: Soft, non-tender, non-distended EXTREMITIES:  No edema; No deformity   ASSESSMENT AND PLAN:    Frequent Unifocal PVC's  Palpitations Multiple medication intolerances, s/p unsuccessful ablation, deep septal origination -continue magnesium taurate  -hold EKG today as symptom burden has been ok & limited med options  -only other med to try for PVC's would be mexiletine but given allergies, hesitant to attempt given she is in a good place with symptom burden currently.  Not in chart on med review.   -plan to update EKG and ECHO at next visit   Hypertension  -home BP recordings reviewed, well controlled on current regimen  -BP is elevated in clinic today but she reports she is worried about her grand daughter who is in [place] currently and there has been a Air cabin crew -therapy limited by medication intolerances   Chronic Diastolic CHF  -LVEF 60-65% -euvolemic on exam     Follow up with Dr. Nelly Laurence or EP APP in 6 months  Signed, Canary Brim, NP-C, AGACNP-BC [facility] - Electrophysiology  07/29/2023, 1:20 PM

## 2023-07-29 ENCOUNTER — Encounter: Payer: Self-pay | Admitting: Pulmonary Disease

## 2023-07-29 ENCOUNTER — Ambulatory Visit: Payer: Medicare Other | Attending: Cardiovascular Disease | Admitting: Pulmonary Disease

## 2023-07-29 VITALS — BP 160/72 | HR 78 | Ht 64.0 in | Wt 142.0 lb

## 2023-07-29 DIAGNOSIS — I493 Ventricular premature depolarization: Secondary | ICD-10-CM | POA: Insufficient documentation

## 2023-07-29 DIAGNOSIS — I10 Essential (primary) hypertension: Secondary | ICD-10-CM | POA: Insufficient documentation

## 2023-07-29 DIAGNOSIS — R002 Palpitations: Secondary | ICD-10-CM | POA: Diagnosis not present

## 2023-07-29 DIAGNOSIS — I5032 Chronic diastolic (congestive) heart failure: Secondary | ICD-10-CM | POA: Diagnosis present

## 2023-07-29 NOTE — Patient Instructions (Signed)
 Medication Instructions:  Your physician recommends that you continue on your current medications as directed. Please refer to the Current Medication list given to you today.  *If you need a refill on your cardiac medications before your next appointment, please call your pharmacy*  Lab Work: None ordered If you have labs (blood work) drawn today and your tests are completely normal, you [place] receive your results only by: MyChart Message (if you have MyChart) OR A paper copy in the mail If you have any lab test that is abnormal or we need to change your treatment, we [place] call you to review the results.  Follow-Up: At [facility] ([place] you and your health needs are our priority.  As part of our continuing mission to provide you with exceptional heart care, we have created designated Provider Care Teams.  These Care Teams include your primary Cardiologist (physician) and Advanced Practice Providers (APPs -  Physician Assistants and Nurse Practitioners) who all work together to provide you with the care you need, when you need it.  Your next appointment:   6 month(s)  Provider:   York Pellant, MD or Canary Brim, NP

## 2023-08-15 ENCOUNTER — Encounter: Payer: Self-pay | Admitting: Internal Medicine

## 2023-08-15 ENCOUNTER — Other Ambulatory Visit: Payer: Self-pay | Admitting: Internal Medicine

## 2023-08-15 DIAGNOSIS — R921 Mammographic calcification found on diagnostic imaging of breast: Secondary | ICD-10-CM

## 2023-08-15 IMAGING — CT CT ANGIO CHEST
3 of 8 series · 17 of 36 positions shown · IV contrast (agent unspecified)
Comparison: Chest radiographs 02/01/2011

CLINICAL DATA: Dyspnea on exertion.  Precordial pain.

EXAM:
CT ANGIOGRAPHY CHEST WITH CONTRAST
TECHNIQUE: Multidetector CT imaging of the chest was performed using the
standard protocol during bolus administration of intravenous
contrast. Multiplanar CT image reconstructions and MIPs were
obtained to evaluate the vascular anatomy.

[Series 7: cta pulmonary 2.00 bv36 s3 · coronal · 0.65mm/px · 1 of 130 slices shown]
[im 65/130  mediastinal]
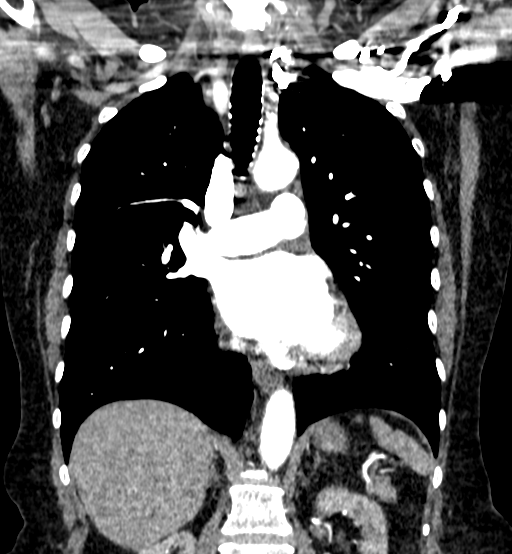

[Series 11: cta pulmonary 1.00 bv36 s3 thins · axial · 0.65mm/px · z∈[+1465,+1735]mm · 7 of 362 slices shown]
[im 46/362  lung]
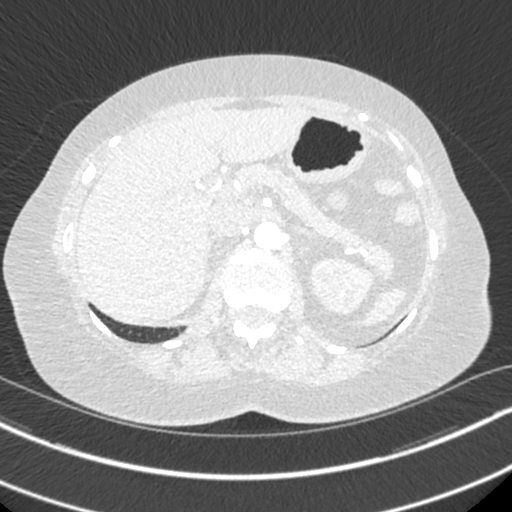
[im 91/362  lung]
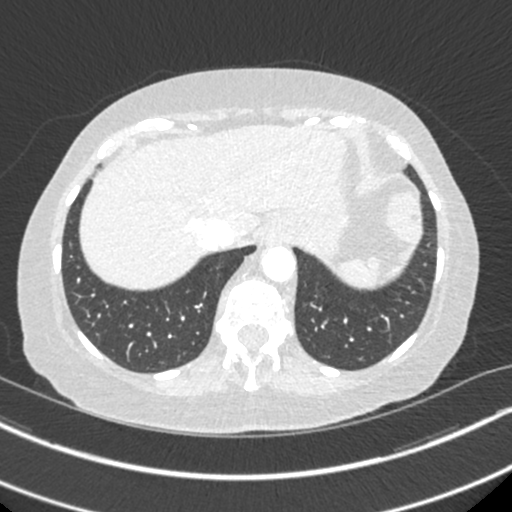
[im 136/362  lung]
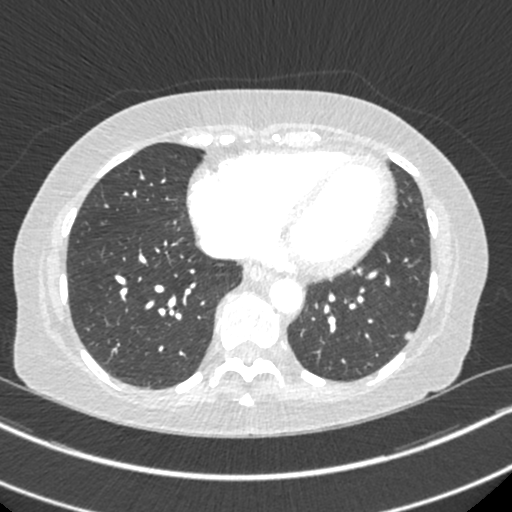
[im 181/362  lung]
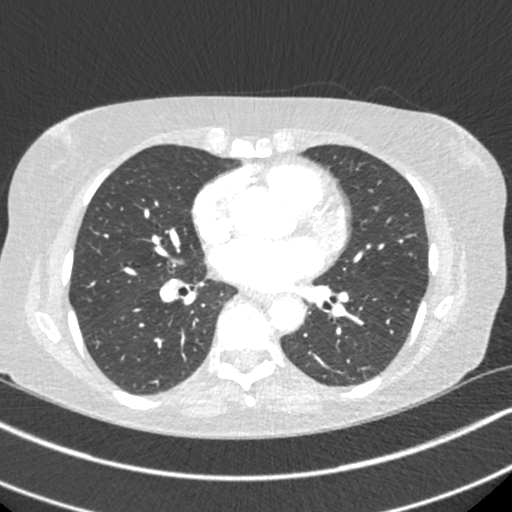
[im 226/362  lung]
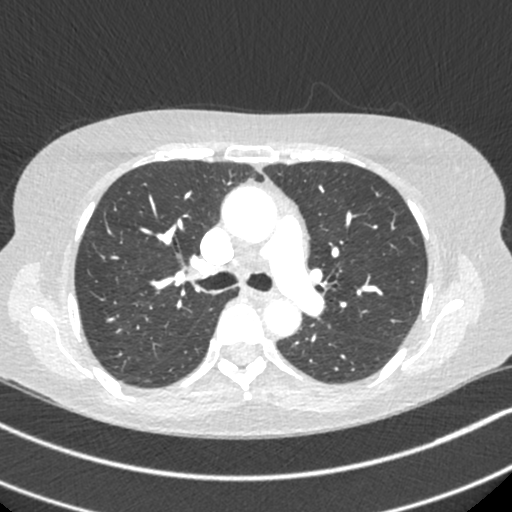
[im 271/362  lung]
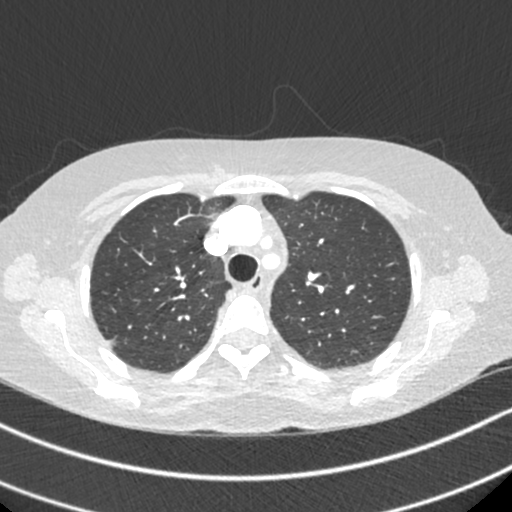
[im 316/362  lung]
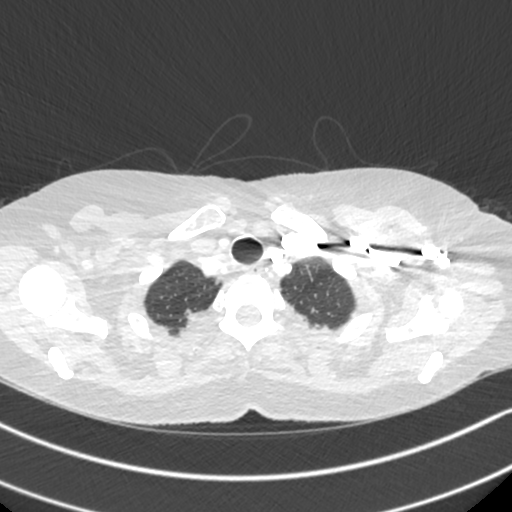

[Series 12: cta pulmonary 1.00 bv36 s3 super d. · axial · 0.65mm/px · z∈[+1457,+1741]mm · 9 of 445 slices shown]
[im 45/445  lung]
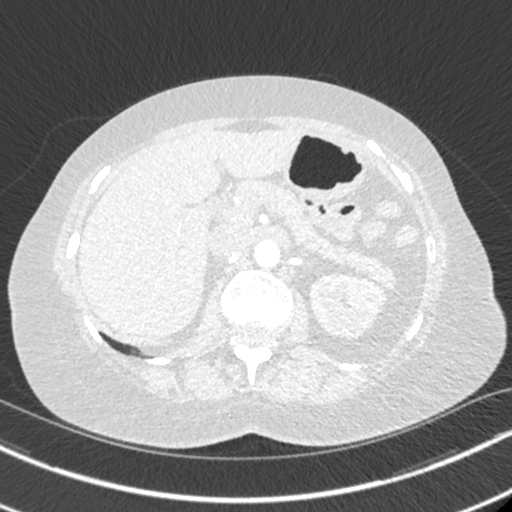
[im 89/445  mediastinal]
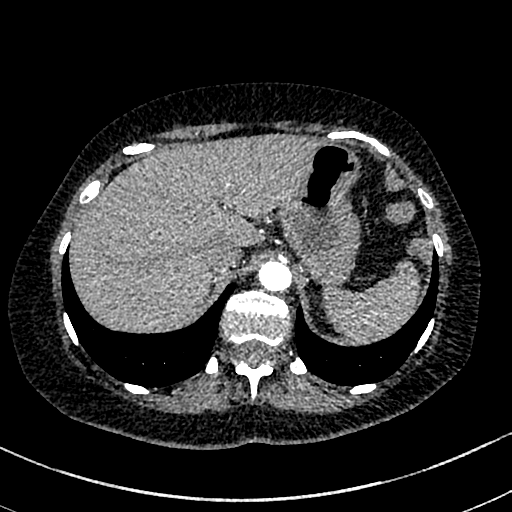
[im 134/445  lung]
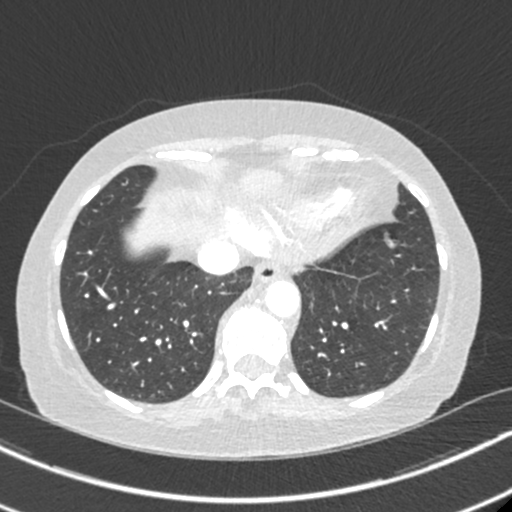
[im 178/445  mediastinal]
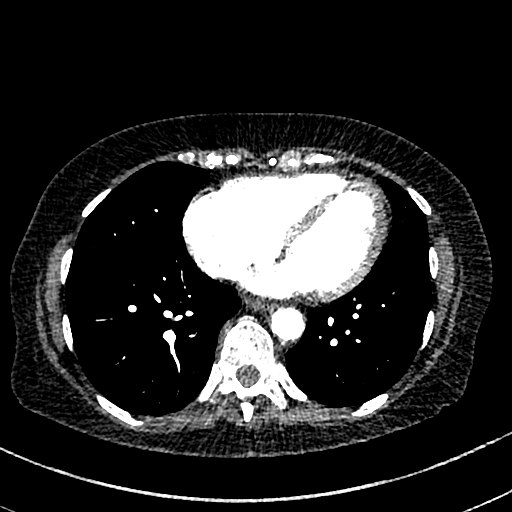
[im 223/445  lung]
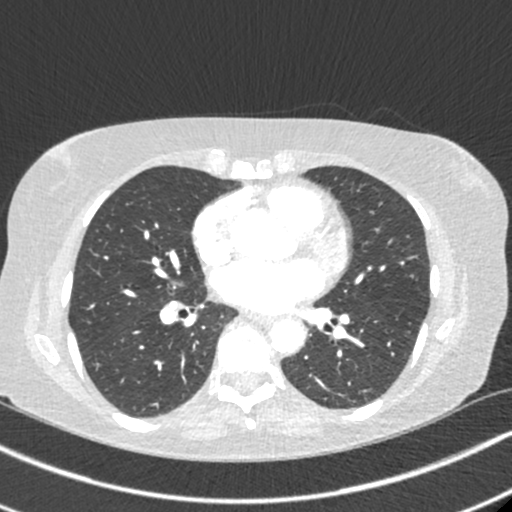
[im 267/445  mediastinal]
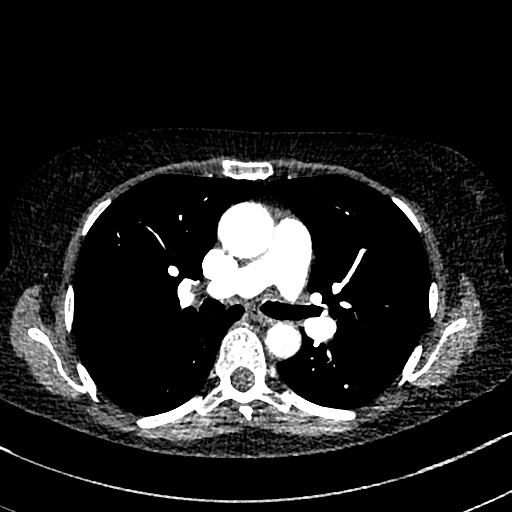
[im 311/445  lung]
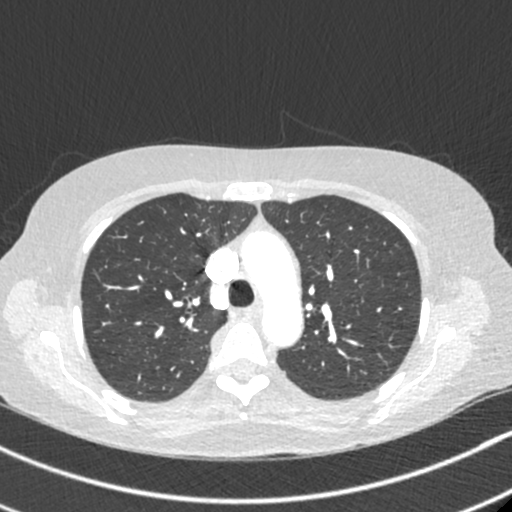
[im 356/445  mediastinal]
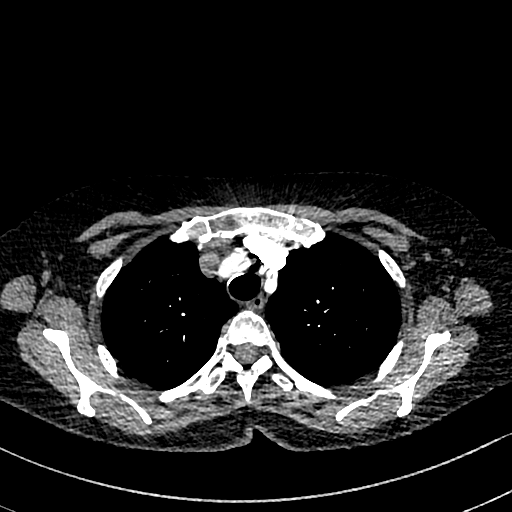
[im 400/445  lung]
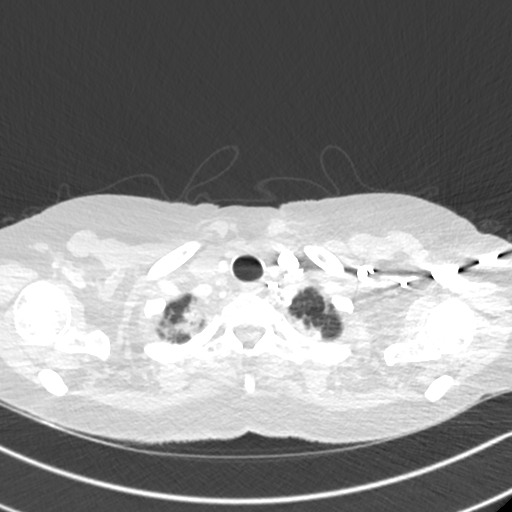

[17 of 36 positions shown; findings below may reference images not displayed]

RADIATION DOSE REDUCTION: This exam was performed according to the
departmental dose-optimization program which includes automated
exposure control, adjustment of the mA and/or kV according to
patient size and/or use of iterative reconstruction technique.

CONTRAST:  60mL [id number] IOPAMIDOL ([id number]) INJECTION 76%
FINDINGS: Cardiovascular: Pulmonary arterial opacification is adequate without
evidence of emboli. There is mild thoracic aortic atherosclerosis
without aneurysm. Mild LAD coronary atherosclerosis is noted. The
heart is normal in size. There is no pericardial effusion.

Mediastinum/Nodes: Subcentimeter calcified thyroid nodule for which
no follow-up imaging is recommended. No enlarged axillary,
mediastinal, or hilar lymph nodes. Unremarkable esophagus.

Lungs/Pleura: No pleural effusion or pneumothorax. Mild biapical
pleuroparenchymal lung scarring. 3 mm nodule in the right lower lobe
abutting the major fissure (series 13, image 114). 7 x 5 mm (mean 6
mm) subpleural nodule in the left lower lobe (series 13, image 112).
Punctate calcified nodule in the superior segment of the left lower
lobe.

Upper Abdomen: Cholecystectomy.

Musculoskeletal: No acute osseous abnormality or suspicious osseous
lesion.

Review of the MIP images confirms the above findings.
IMPRESSION: 1. No evidence of pulmonary emboli or other acute abnormality in the
chest.
2. Multiple pulmonary nodules. Most severe: 6 mm left lower lobe
solid pulmonary nodule. Recommend a non-contrast Chest CT at 3-6
months. If patient is high risk for malignancy, recommend an
additional non-contrast Chest CT at 18-24 months; if patient is low
risk for malignancy a non-contrast Chest CT at 18-24 months is
optional.
These guidelines do not apply to immunocompromised patients and
patients with cancer. Follow up in patients with significant
comorbidities as clinically warranted. For lung cancer screening,
adhere to Lung-RADS guidelines. Reference: Radiology. 8908;
3. Aortic Atherosclerosis (IDEHW-SPH.H).

## 2023-09-09 ENCOUNTER — Ambulatory Visit
Admission: RE | Admit: 2023-09-09 | Discharge: 2023-09-09 | Disposition: A | Discharge status: Home / Self Care | Source: Ambulatory Visit | Attending: Internal Medicine | Admitting: Internal Medicine

## 2023-09-09 DIAGNOSIS — R921 Mammographic calcification found on diagnostic imaging of breast: Secondary | ICD-10-CM

## 2023-10-12 ENCOUNTER — Ambulatory Visit
Admission: RE | Admit: 2023-10-12 | Discharge: 2023-10-12 | Disposition: A | Discharge status: Home / Self Care | Source: Ambulatory Visit | Attending: Adult Health | Admitting: Adult Health

## 2023-10-12 DIAGNOSIS — R918 Other nonspecific abnormal finding of lung field: Secondary | ICD-10-CM

## 2023-10-25 ENCOUNTER — Ambulatory Visit: Payer: Self-pay | Admitting: Adult Health

## 2023-11-03 IMAGING — MG MM DIGITAL DIAGNOSTIC UNILAT*R*
4 series · 4 of 8 positions shown · non-contrast
Comparison: Previous exams.

CLINICAL DATA: Screening recall for right breast calcifications.

EXAM:
DIGITAL DIAGNOSTIC UNILATERAL RIGHT MAMMOGRAM
TECHNIQUE: Right digital diagnostic mammography was performed. Mammographic
images were processed with CAD.

[R ML]
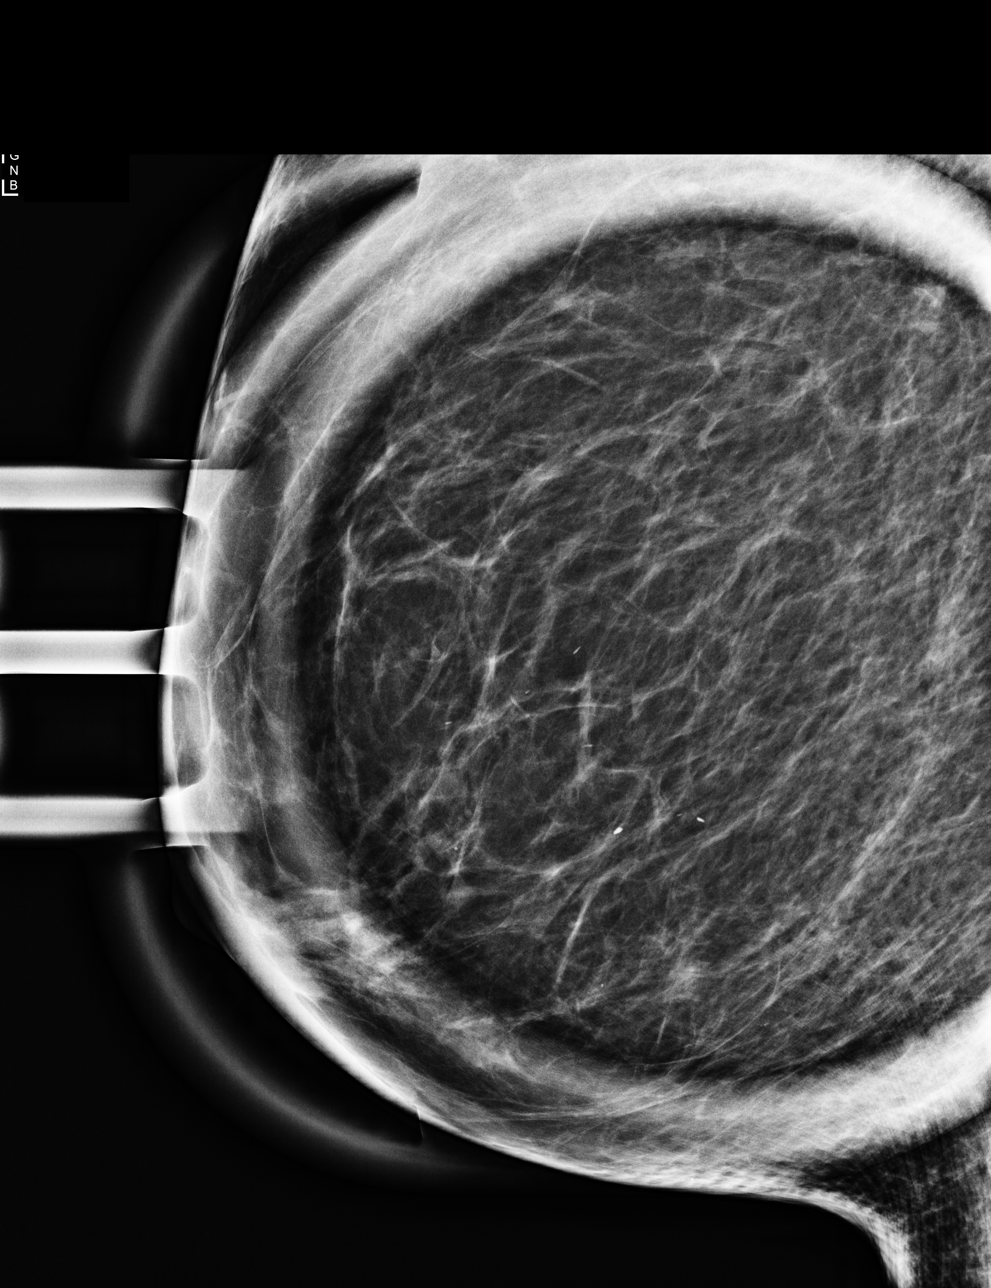

[R CC]
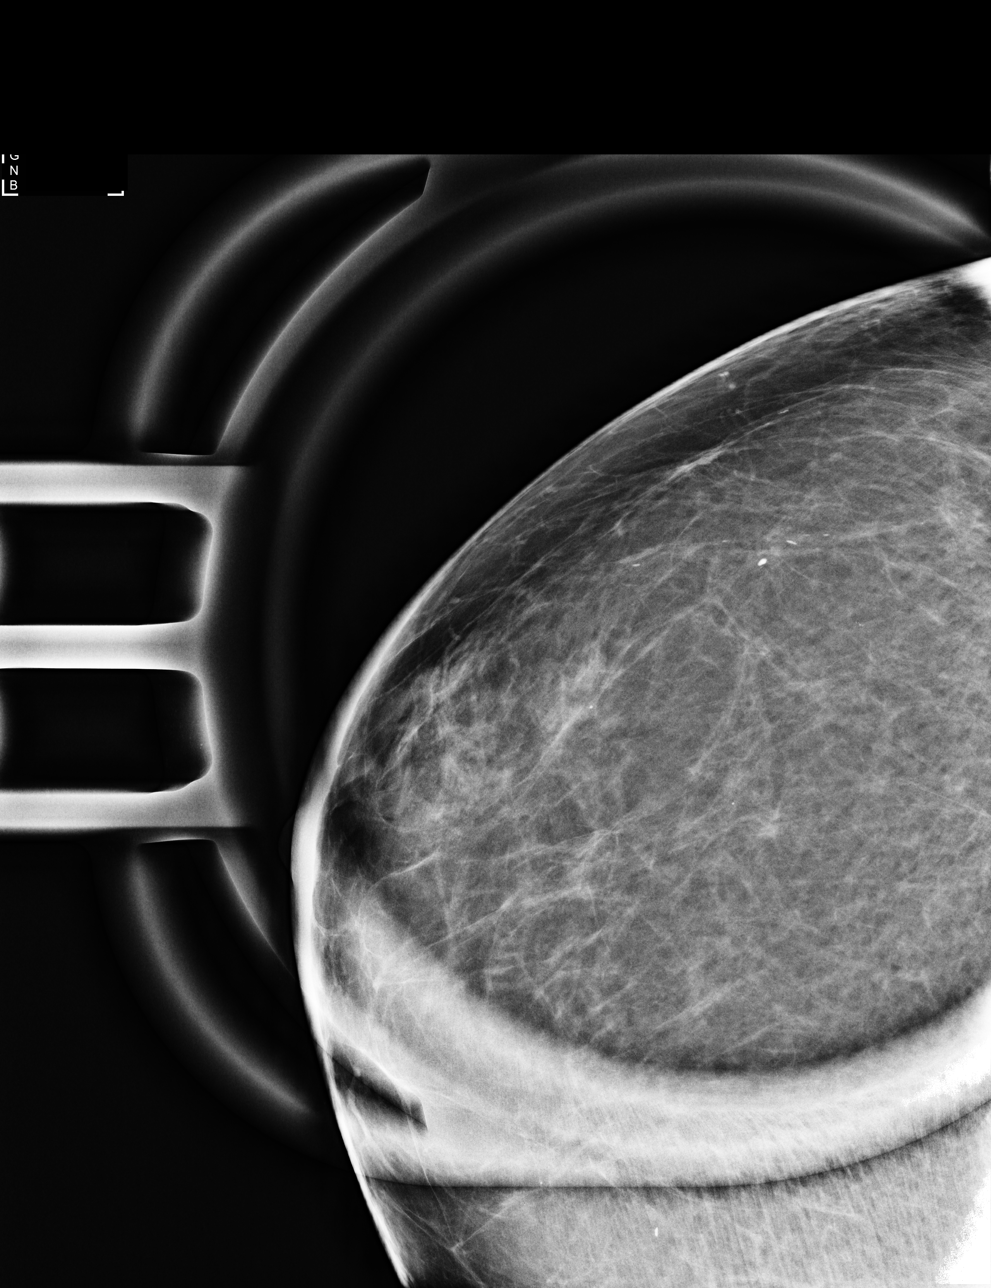

[R ML synth-2D]
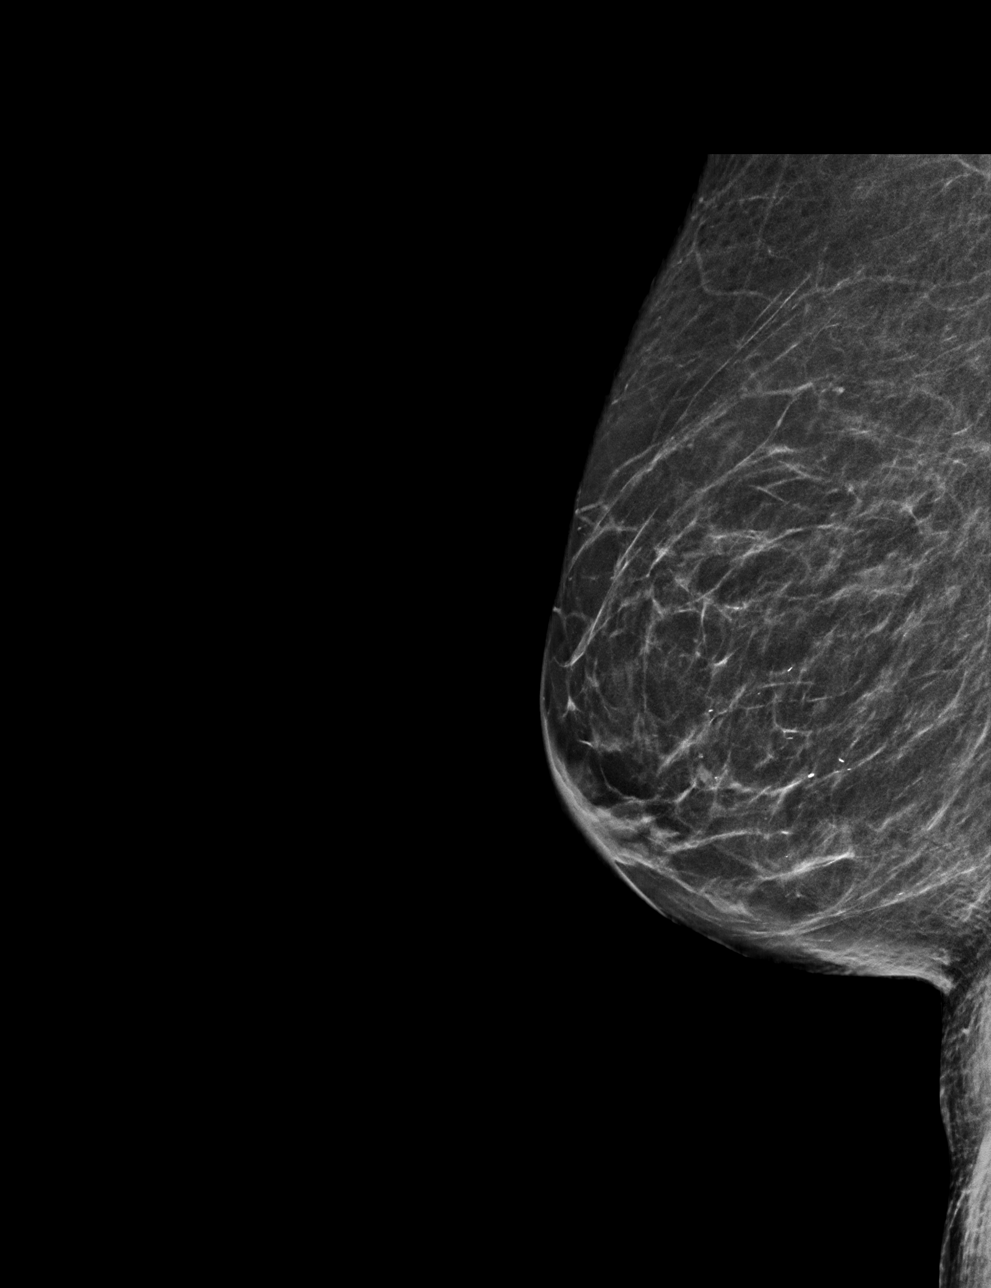

[R ML tomo · tomo slice 31/61.0]
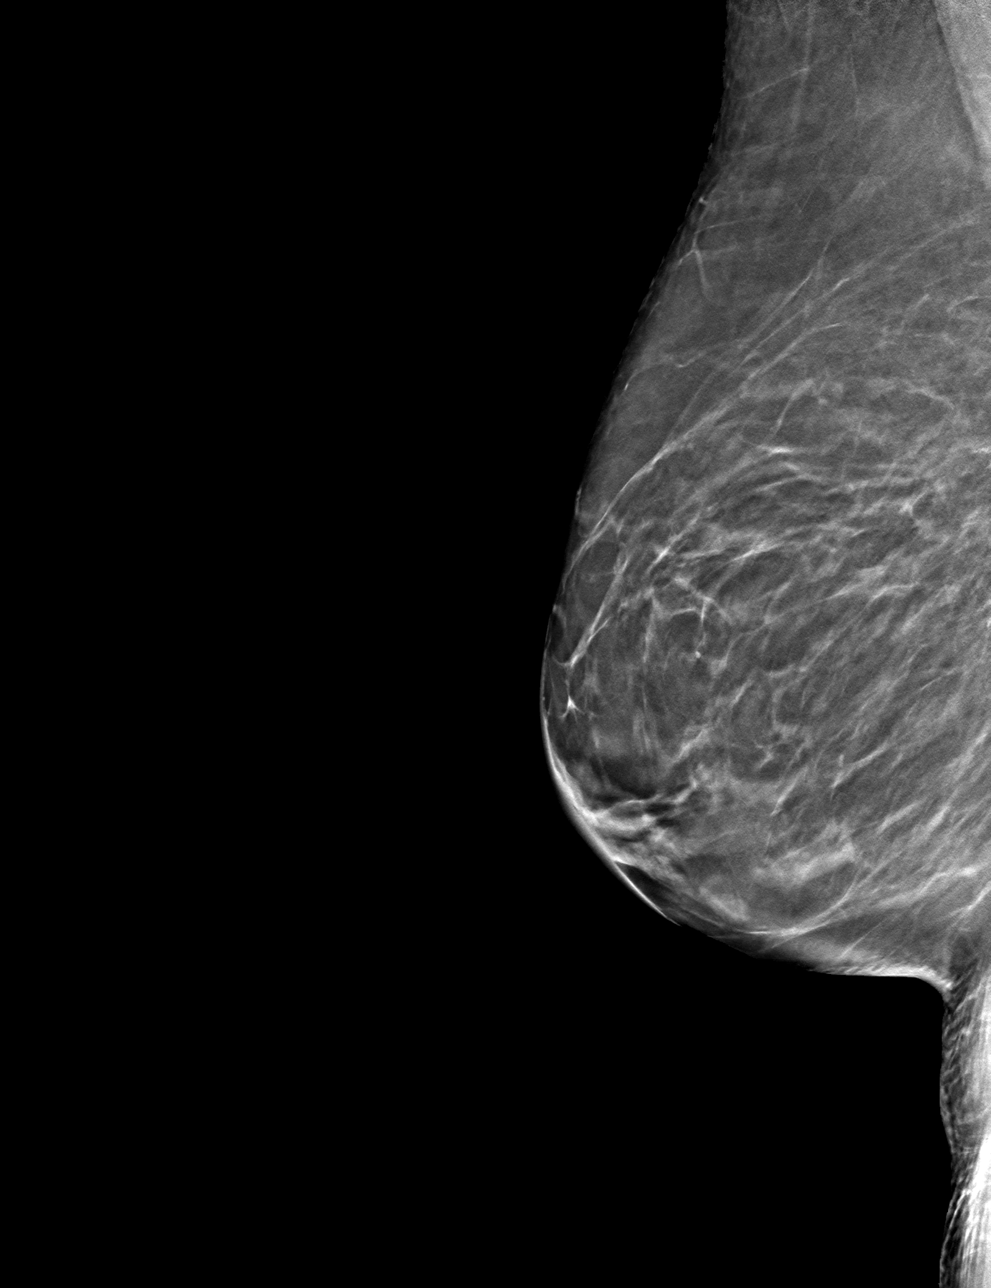

[4 of 8 positions shown; findings below may reference images not displayed]

ACR Breast Density Category b: There are scattered areas of
fibroglandular density.
FINDINGS: Spot compression magnification views were performed over the outer
right breast demonstrating loosely grouped rod-like calcifications
demonstrating imaging features suggestive of early secretory
calcifications.
IMPRESSION: Probably benign right breast calcifications.

RECOMMENDATION:
Recommend six-month follow-up diagnostic mammography with
magnification views of the right breast.

I have discussed the findings and recommendations with the patient.
If applicable, a reminder letter will be sent to the patient
regarding the next appointment.

BI-RADS CATEGORY  3: Probably benign.

## 2023-12-07 IMAGING — CT CT CHEST SUPER D W/O CM
2 of 5 series · 15 of 36 positions shown, 18 images · non-contrast
Comparison: 06/16/2021

CLINICAL DATA: Lung nodules.

EXAM:
CT CHEST WITHOUT CONTRAST
TECHNIQUE: Multidetector CT imaging of the chest was performed using thin slice
collimation for electromagnetic bronchoscopy planning purposes,
without intravenous contrast.
RADIATION DOSE REDUCTION: This exam was performed according to the
departmental dose-optimization program which includes automated
exposure control, adjustment of the mA and/or kV according to
patient size and/or use of iterative reconstruction technique.

[Series 4: chest 2.00 br40 s3 · coronal · 0.66mm/px · 3 of 128 slices shown]
[im 26/128  lung]
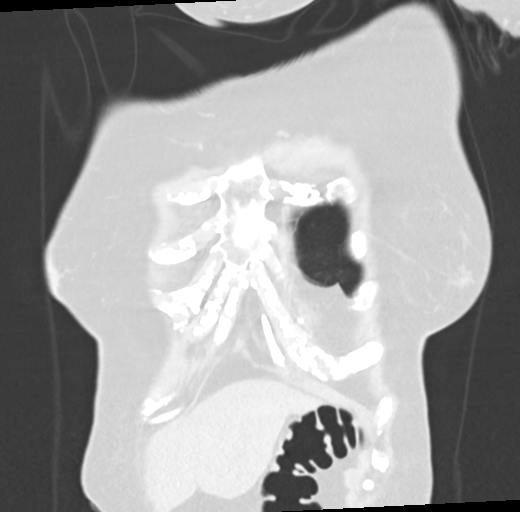
[im 51/128  lung]
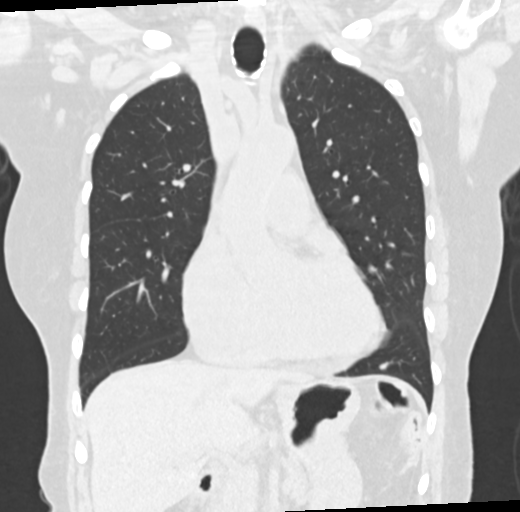
[im 77/128  lung]
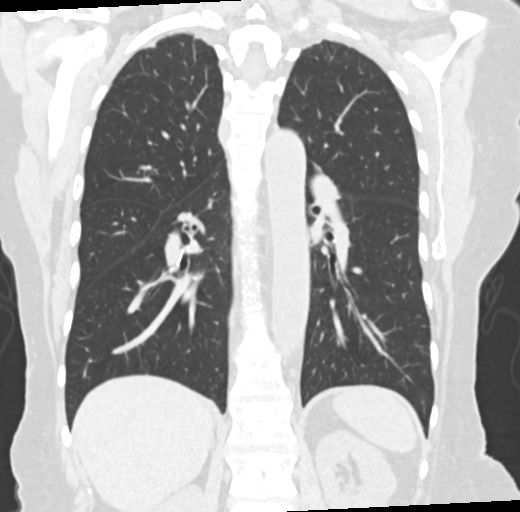

[Series 10: chest 1.00 br40 s3 super d · axial · 0.67mm/px · z∈[+1446,+1736]mm · 12 of 419 slices shown, 15 images]
[im 28/419  mediastinal]
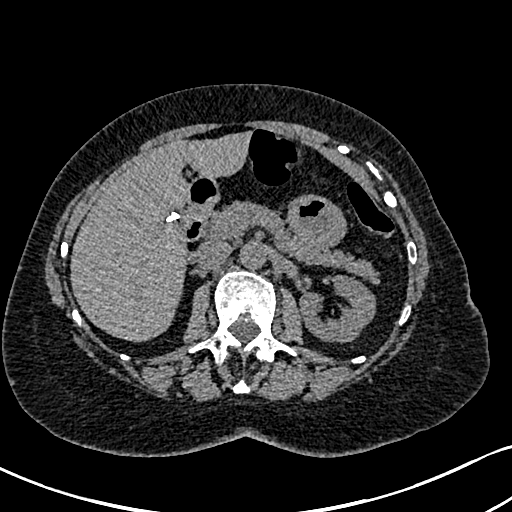
[im 28/419  lung]
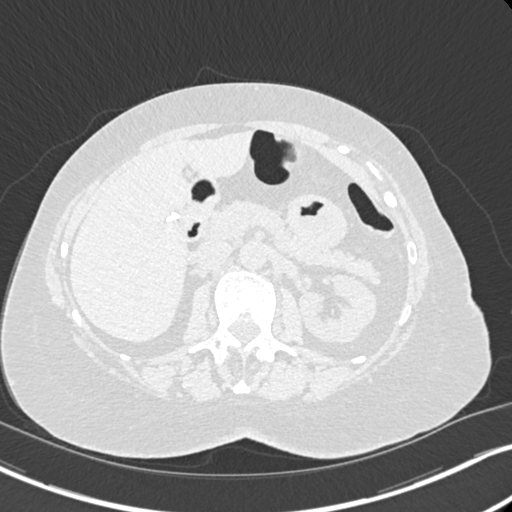
[im 56/419  lung]
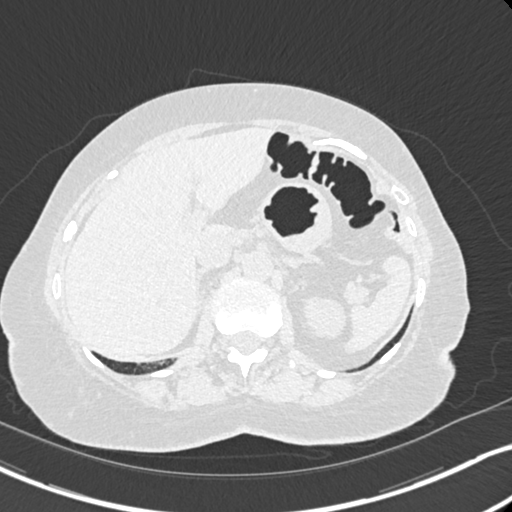
[im 84/419  lung]
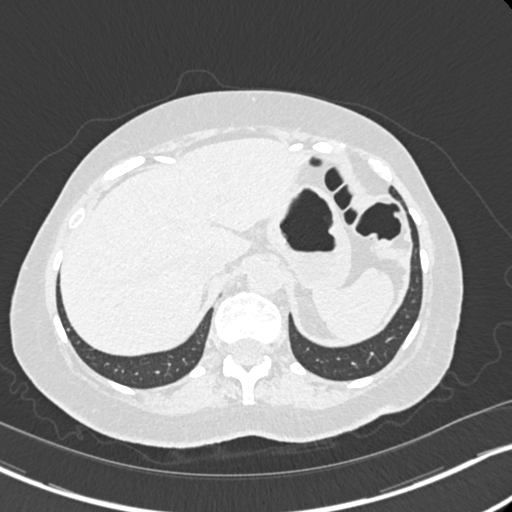
[im 140/419  lung]
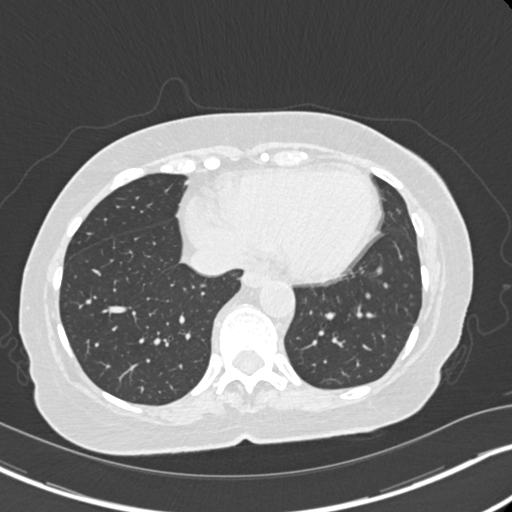
[im 168/419  mediastinal]
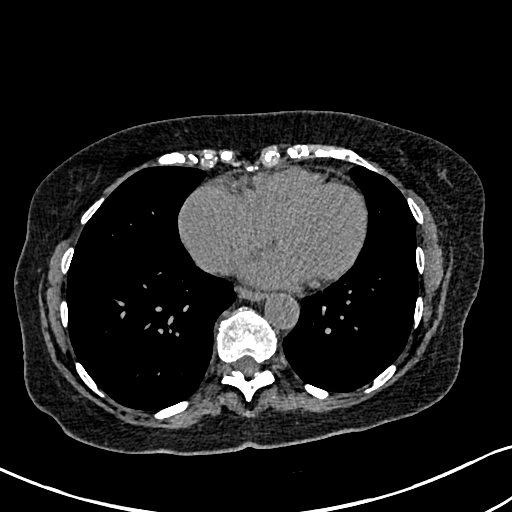
[im 168/419  lung]
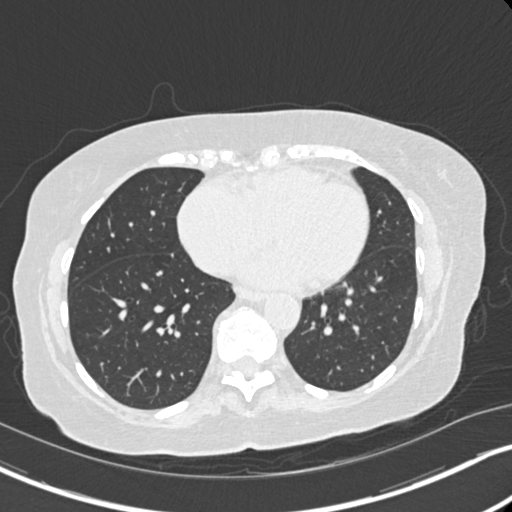
[im 196/419  lung]
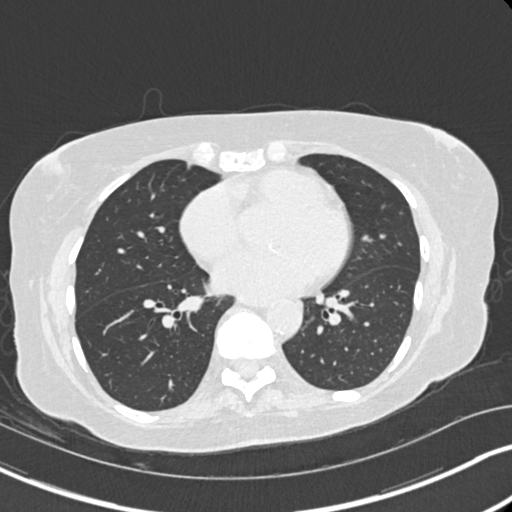
[im 223/419  lung]
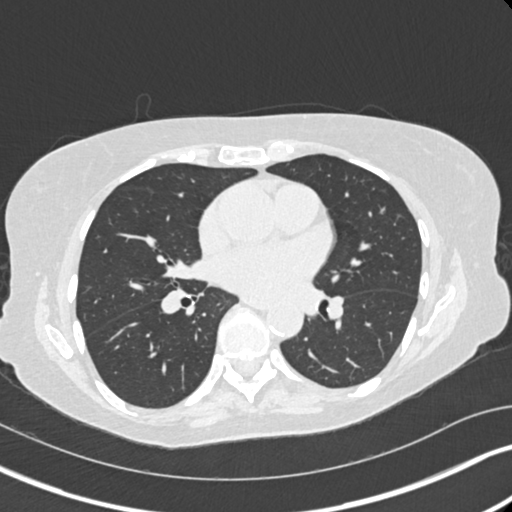
[im 251/419  lung]
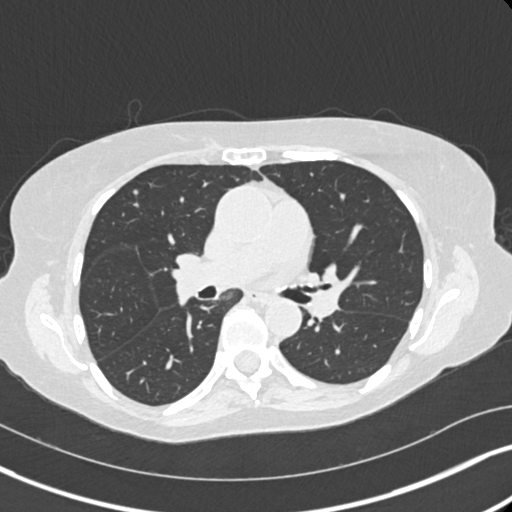
[im 279/419  mediastinal]
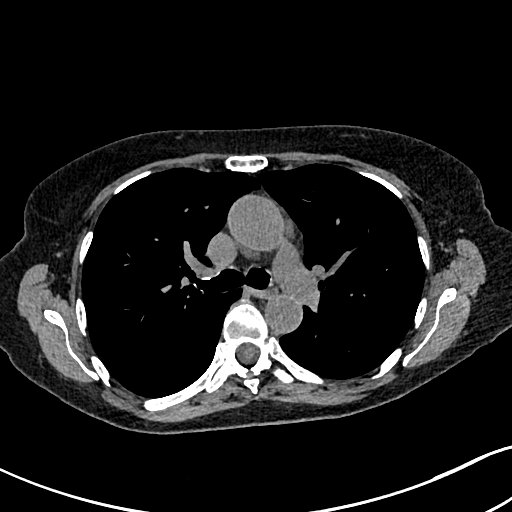
[im 279/419  lung]
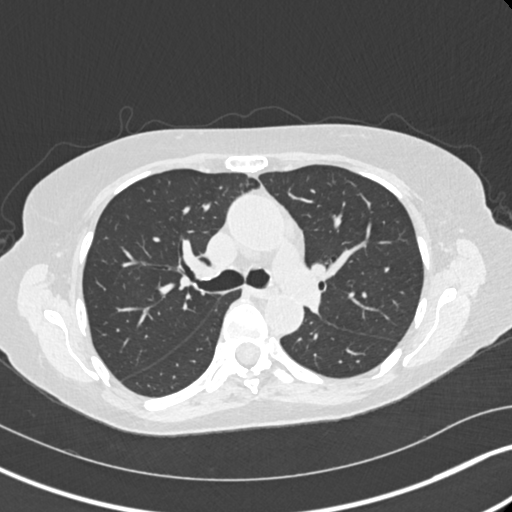
[im 335/419  lung]
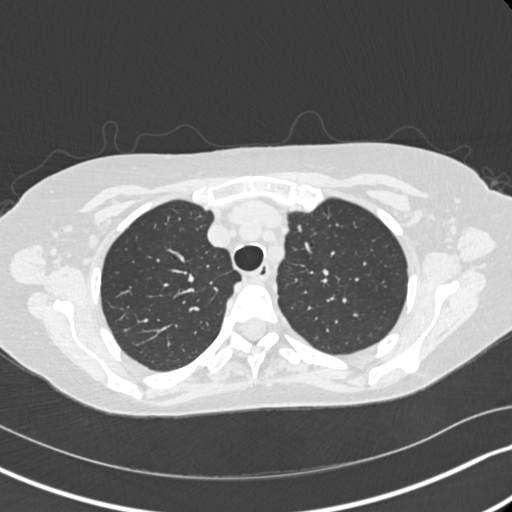
[im 363/419  lung]
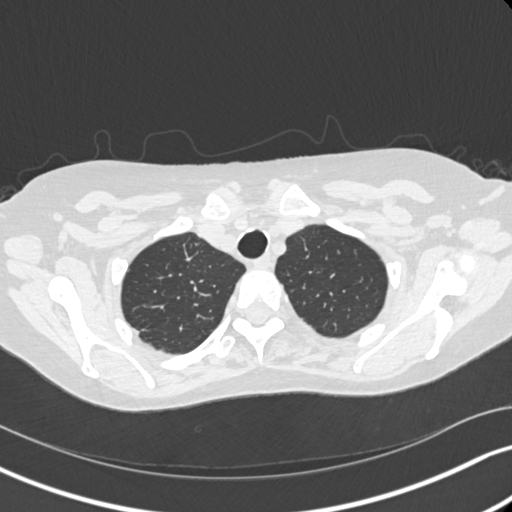
[im 391/419  lung]
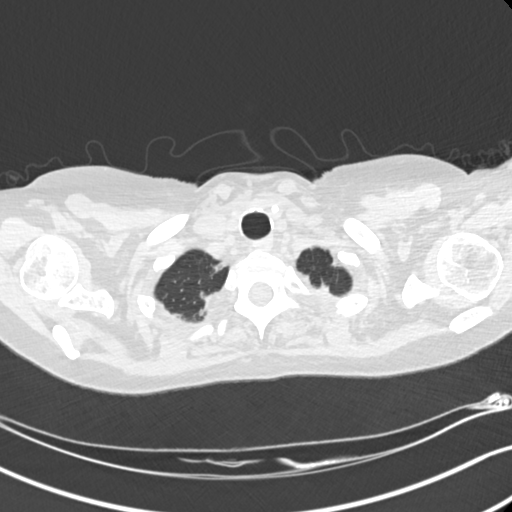

[15 of 36 positions shown; findings below may reference images not displayed]

FINDINGS: Cardiovascular: Atherosclerotic calcification of the thoracic aorta
and left anterior descending coronary artery.

Mediastinum/Nodes: Unremarkable

Lungs/Pleura: Biapical pleuroparenchymal scarring.

Left lower lobe subpleural nodule measures 8 by 5 by 7 mm (volume =
100 mm^3) on image 105 series 8, formerly the same by my
measurements.

11 by 4 by 3 mm (volume = 70 mm^3) left lower lobe pulmonary
nodule on image 8983 series 13. Other small nodules include a 3 mm
right lower lobe subpleural nodule on image 105 series 8 and a 4 mm
right upper lobe nodule on image 68 series 8, both stable.

Upper Abdomen: Cholecystectomy.

Musculoskeletal: Unremarkable
IMPRESSION: 1. Stable bilateral pulmonary nodules. The largest of these is 100
cubic mm in the left lower lobe, and unchanged over the last 4
months. Future CT at 18-24 months (from today's scan) is considered
optional for low-risk patients, but is recommended for high-risk
patients. This recommendation follows the consensus statement:
Guidelines for Management of Incidental Pulmonary Nodules Detected
2. Aortic Atherosclerosis (JIL4T-IFA.A). Left anterior descending
coronary atherosclerosis.

## 2024-02-01 ENCOUNTER — Encounter: Payer: Self-pay | Admitting: Cardiovascular Disease

## 2024-02-01 ENCOUNTER — Ambulatory Visit: Attending: Cardiovascular Disease

## 2024-02-01 ENCOUNTER — Ambulatory Visit: Attending: Cardiology | Admitting: Cardiovascular Disease

## 2024-02-01 VITALS — BP 160/78 | HR 65 | Ht 64.25 in | Wt 143.9 lb

## 2024-02-01 DIAGNOSIS — I493 Ventricular premature depolarization: Secondary | ICD-10-CM | POA: Diagnosis present

## 2024-02-01 DIAGNOSIS — I10 Essential (primary) hypertension: Secondary | ICD-10-CM | POA: Diagnosis present

## 2024-02-01 DIAGNOSIS — I5032 Chronic diastolic (congestive) heart failure: Secondary | ICD-10-CM | POA: Diagnosis present

## 2024-02-01 DIAGNOSIS — R002 Palpitations: Secondary | ICD-10-CM | POA: Diagnosis present

## 2024-02-01 MED ORDER — ATORVASTATIN CALCIUM 10 MG PO TABS: 10.0000 mg | ORAL_TABLET | Freq: Every day | ORAL | 3 refills | Status: AC

## 2024-02-01 NOTE — Progress Notes (Unsigned)
 Enrolled for [employer] to mail a ZIO XT long term holter monitor to the patients address on file.

## 2024-02-01 NOTE — Patient Instructions (Signed)
 Medication Instructions:  Your physician has recommended you make the following change in your medication:   ** Begin Crestor 5mg  - 1 tablet by mouth daily.  *If you need a refill on your cardiac medications before your next appointment, please call your pharmacy*  Lab Work: None ordered.  If you have labs (blood work) drawn today and your tests are completely normal, you will receive your results only by: MyChart Message (if you have MyChart) OR A paper copy in the mail If you have any lab test that is abnormal or we need to change your treatment, we will call you to review the results.  Testing/Procedures:  Your physician has requested that you have an echocardiogram in 5 months.  Echocardiography is a painless test that uses sound waves to create images of your heart. It provides your doctor with information about the size and shape of your heart and how well your heart's chambers and valves are working. This procedure takes approximately one hour. There are no restrictions for this procedure. Please do NOT wear cologne, perfume, aftershave, or lotions (deodorant is allowed). Please arrive 15 minutes prior to your appointment time.  Please note: We ask at that you not bring children with you during ultrasound (echo/ vascular) testing. Due to room size and safety concerns, children are not allowed in the ultrasound rooms during exams. Our front office staff cannot provide observation of children in our lobby area while testing is being conducted. An adult accompanying a patient to their appointment will only be allowed in the ultrasound room at the discretion of the ultrasound technician under special circumstances. We apologize for any inconvenience.    ZIO XT- Long Term Monitor Instructions - in 5 months  Your physician has requested you wear a ZIO patch monitor for 3 days.  This is a single patch monitor. [employer] supplies one patch monitor per enrollment. Additional stickers are not  available. Please do not apply patch if you will be having a Nuclear Stress Test,  Echocardiogram, Cardiac CT, MRI, or Chest Xray during the period you would be wearing the  monitor. The patch cannot be worn during these tests. You cannot remove and re-apply the  ZIO XT patch monitor.  Your ZIO patch monitor will be mailed 3 day USPS to your address on file. It may take 3-5 days  to receive your monitor after you have been enrolled.  Once you have received your monitor, please review the enclosed instructions. Your monitor  has already been registered assigning a specific monitor serial # to you.  Billing and Patient Assistance Program Information  We have supplied [employer] with any of your insurance information on file for billing purposes. [employer] offers a sliding scale Patient Assistance Program for patients that do not have  insurance, or whose insurance does not completely cover the cost of the ZIO monitor.  You must apply for the Patient Assistance Program to qualify for this discounted rate.  To apply, please call [employer] at [phone], select option 4, select option 2, ask to apply for  Patient Assistance Program. Meredeth will ask your household income, and how many people  are in your household. They will quote your out-of-pocket cost based on that information.  [employer] will also be able to set up a 34-month, interest-free payment plan if needed.  Applying the monitor   Shave hair from upper left chest.  Hold abrader disc by orange tab. Rub abrader in 40 strokes over the upper left chest as  indicated  in your monitor instructions.  Clean area with 4 enclosed alcohol pads. Let dry.  Apply patch as indicated in monitor instructions. Patch will be placed under collarbone on left  side of chest with arrow pointing upward.  Rub patch adhesive wings for 2 minutes. Remove white label marked 1. Remove the white  label marked 2. Rub patch adhesive wings for 2 additional minutes.   While looking in a mirror, press and release button in center of patch. A small green light will  flash 3-4 times. This will be your only indicator that the monitor has been turned on.  Do not shower for the first 24 hours. You may shower after the first 24 hours.  Press the button if you feel a symptom. You will hear a small click. Record Date, Time and  Symptom in the Patient Logbook.  When you are ready to remove the patch, follow instructions on the last 2 pages of Patient  Logbook. Stick patch monitor onto the last page of Patient Logbook.  Place Patient Logbook in the blue and white box. Use locking tab on box and tape box closed  securely. The blue and white box has prepaid postage on it. Please place it in the mailbox as  soon as possible. Your physician should have your test results approximately 7 days after the  monitor has been mailed back to [facility].  Call [facility] Customer Care at [phone] if you have questions regarding  your ZIO XT patch monitor. Call them immediately if you see an orange light blinking on your  monitor.  If your monitor falls off in less than 4 days, contact our Monitor department at [phone].  If your monitor becomes loose or falls off after 4 days call [employer] at [phone] for  suggestions on securing your monitor   Follow-Up: At [facility], you and your health needs are our priority.  As part of our continuing mission to provide you with exceptional heart care, our providers are all part of one team.  This team includes your primary Cardiologist (physician) and Advanced Practice Providers or APPs (Physician Assistants and Nurse Practitioners) who all work together to provide you with the care you need, when you need it.  Your next appointment:   6 months with Dr Nancey  We recommend signing up for the patient portal called MyChart.  Sign up information is provided on this After Visit Summary.  MyChart is used to  connect with patients for Virtual Visits (Telemedicine).  Patients are able to view lab/test results, encounter notes, upcoming appointments, etc.  Non-urgent messages can be sent to your provider as well.   To learn more about what you can do with MyChart, go to [email or web address].

## 2024-02-01 NOTE — Progress Notes (Signed)
 Electrophysiology Office Note:    Date:  02/01/2024   ID:  [name], DOB 01/21/1941, MRN [id number]  PCP:  Ransom Other, MD   [facility] Providers Cardiologist:  None Electrophysiologist:  Eulas FORBES Furbish, MD     Referring MD: Ransom Other, MD   History of Present Illness:    Christine Hall is a 83 y.o. female with a hx listed below, significant for HTN and frequent PVCs, referred for arrhythmia management.  Per referral records, she was very active and functional until October or November of 2022, at which time she was diagnosed with frequent PVCs. She was treated with diuretics.  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  Cardiac MRI showed normal structure and function.  She has tried many medications but has not been able to tolerate them due to various intolerances. She most recently tried flecainide  but felt worse taking it.  She underwent an attempted PVC ablation on July 28, 2022. I was able to suppress transiently the PVCs with ablation at the inferior RV septum adjacent to the tricuspid valve. She continues to have occasional palpitations, fatigue. Not improved from the ablation.  Today, she reports she has at baseline.  She reports that she is doing very well today.   EKGs/Labs/Other Studies Reviewed Today:     TTE 11/08/2022 EF 60-65%.  Mild mitral regurgitation.  Cardiac MR 10/01/2021: normal structure and function  EKG:  Last EKG results: today. Sinus rhythm with PVCs in bigeminy  ECGs in epic show frequent PVCs, occasionally with bigeminy. PVCs are monomorphic, with precordial transition in V4, ++II, +aVF, iso III. PVC QRS is fractionated, and MDI is fairly late in several leads.  Monitor  Sinus rhythm HR 54-96, avg 69 38.2% PVCs, frequently occurring in bigeminy Symptoms correlated with PVCs    Recent Labs: No results found for  requested labs within last 365 days.     Physical Exam:    VS:  BP (!) 160/78 (BP Location: Left Arm, Patient Position: Sitting, Cuff Size: Normal)   Pulse 65   Ht 5' 4.25 (1.632 m)   Wt 143 lb 14.4 oz (65.3 kg)   SpO2 98%   BMI 24.51 kg/m     Wt Readings from Last 3 Encounters:  02/01/24 143 lb 14.4 oz (65.3 kg)  07/29/23 142 lb (64.4 kg)  01/27/23 144 lb 9.6 oz (65.6 kg)     GEN: Well nourished, well developed in no acute distress CARDIAC: Irregular rhythm, no murmurs, rubs, gallops RESPIRATORY:  Normal work of breathing MUSCULOSKELETAL: no edema    ASSESSMENT & PLAN:    Frequent unifocal PVCs:   Status-post unsuccessful PVC ablation. PVC was arising from deep within the septum, unable to effect with ablation at high output She continues to have a high burden of PVCs EF remains normal She does not tolerate medications well - has a history of tongue swelling with amiodarone, flecainide , diltiazem , multiple beta-blockers.  Doing very well on magnesium  taurate --no PVCs today Will repeat 72-hour monitor to quantify PVC burden, TTE for EF prior to follow-up visit in 6 months   Chronic CHF with preserved EF.  I suspect PVCs are contributing I reassured her that her heart function and strength is normal  Hypertension:   on HCTZ 12.5, lisinopril  20, HCTZ 12.5 She reports that BP control is much better at home Medical therapy is limited due to medication intolerance  Atherosclerosis, coronary artery calcification  Incidentally noted on CT Will start rosuvastatin 5 mg daily --will see if she tolerates it       Medication Adjustments/Labs and Tests Ordered: Current medicines are reviewed at length with the patient today.  Concerns regarding medicines are outlined above.  Orders Placed This Encounter  Procedures   EKG 12-Lead   No orders of the defined types were placed in this encounter.    Signed, Eulas FORBES Furbish, MD  02/01/2024 12:20 PM    [facility]

## 2024-06-27 ENCOUNTER — Ambulatory Visit (HOSPITAL_COMMUNITY)

## 2024-08-15 ENCOUNTER — Ambulatory Visit: Admitting: Cardiovascular Disease
